# Patient Record
Sex: Male | Born: 2000 | Race: White | Hispanic: No | Marital: Single | Smoking: Never smoker
Health system: Southern US, Community
[De-identification: ages and names within clinical notes are randomized; demographics above are authoritative.]

## PROBLEM LIST (undated history)

## (undated) DIAGNOSIS — Z789 Other specified health status: Secondary | ICD-10-CM

## (undated) HISTORY — PX: ELBOW SURGERY: SHX618

## (undated) HISTORY — PX: WISDOM TOOTH EXTRACTION: SHX21

---

## 2019-10-20 ENCOUNTER — Encounter: Payer: Self-pay | Admitting: Orthopedic Surgery

## 2019-10-20 ENCOUNTER — Ambulatory Visit (INDEPENDENT_AMBULATORY_CARE_PROVIDER_SITE_OTHER): Payer: 59 | Admitting: Orthopedic Surgery

## 2019-10-20 ENCOUNTER — Ambulatory Visit: Payer: Self-pay

## 2019-10-20 ENCOUNTER — Other Ambulatory Visit: Payer: Self-pay

## 2019-10-20 DIAGNOSIS — M25572 Pain in left ankle and joints of left foot: Secondary | ICD-10-CM | POA: Diagnosis not present

## 2019-10-20 NOTE — Progress Notes (Signed)
Office Visit Note   Patient: Adrian Henry           Date of Birth: 30-Jan-2001           MRN: [id number] Visit Date: 10/20/2019 Requested by: No referring provider defined for this encounter. PCP: Patient, No Pcp Per  Subjective: Chief Complaint  Patient presents with  . 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  Was rounding the base when he rolled the ankle.  He believes it was an inversion injury.  He is not really reporting any pain on the lateral side.  All of his pain is medially based.  Denies any fibular tenderness.  Denies any numbness and tingling in the foot.  He is able to ambulate.              ROS: All systems reviewed are negative as they relate to the chief complaint within the history of present illness.  Patient denies  fevers or chills.   Assessment & Plan: Visit Diagnoses:  1. Pain in left ankle and joints of left foot     Plan: Impression is left ankle pain with deltoid sprain.  The deltoid ligament has some hypoechoic regions consistent with injury.  Posterior tib tendon on ultrasound exam looks intact.  No fractures on plain radiographs.  No real lateral sided tenderness or instability although he does have some pain with subtalar range of motion.  Plan is ASO with progressive rehabilitation.  Communicated to the trainer Joselyn Glassman our plan.  I think he should be able to get back in the game within about 8 days.  Continue with anti-inflammatories and icing as well.  Okay for weightbearing as tolerated.  Follow-Up Instructions: Return if symptoms worsen or fail to improve.   Orders:  Orders Placed This Encounter  Procedures  . XR Ankle Complete Left   No orders of the defined types were placed in this encounter.     Procedures: No procedures performed   Clinical Data: No additional findings.  Objective: Vital Signs: There were no vitals taken for this visit.  Physical Exam:    Constitutional: Patient appears well-developed HEENT:  Head: Normocephalic Eyes:EOM are normal Neck: Normal range of motion Cardiovascular: Normal rate Pulmonary/chest: Effort normal Neurologic: Patient is alert Skin: Skin is warm Psychiatric: Patient has normal mood and affect    Ortho Exam: Ortho exam demonstrates normal gait alignment.  He is able to stand on his toes.  Has some pain on the left with subtalar motion but no pain with transverse tarsal or tibiotalar motion.  Palpable intact nontender anterior to posterior to peroneal Achilles tendons.  Pedal pulses palpable.  No masses lymphadenopathy or skin changes noted in that foot region although he does have a little bit of swelling medially around the medial malleolus.  No ATFL sinus Tarsi or CFL tenderness.  Stability is good anterior drawer testing bilaterally.  Ultrasound exam demonstrates intact posterior tib tendon and a few hypoechoic regions in the deltoid mid substance region consistent with noncomplete deltoid ligament injury.  No proximal middle or distal fibular tenderness is present.  Syndesmosis is stable.  Specialty Comments:  No specialty comments available.  Imaging: XR Ankle Complete Left  Result Date: 10/20/2019 AP lateral mortise left ankle reviewed.  No acute fracture.  Mortise is symmetric and stable.  Normal left ankle    PMFS History: There are no problems to display for  this patient.  History reviewed. No pertinent past medical history.  History reviewed. No pertinent family history.  History reviewed. No pertinent surgical history. Social History   Occupational History  . Not on file  Tobacco Use  . Smoking status: Not on file  Substance and Sexual Activity  . Alcohol use: Not on file  . Drug use: Not on file  . Sexual activity: Not on file

## 2020-01-27 ENCOUNTER — Ambulatory Visit (INDEPENDENT_AMBULATORY_CARE_PROVIDER_SITE_OTHER): Payer: 59 | Admitting: Orthopedic Surgery

## 2020-01-27 ENCOUNTER — Encounter: Payer: Self-pay | Admitting: Orthopedic Surgery

## 2020-01-27 ENCOUNTER — Other Ambulatory Visit: Payer: Self-pay

## 2020-01-27 VITALS — Ht 76.0 in | Wt 187.0 lb

## 2020-01-27 DIAGNOSIS — M25521 Pain in right elbow: Secondary | ICD-10-CM | POA: Diagnosis not present

## 2020-01-28 ENCOUNTER — Encounter: Payer: Self-pay | Admitting: Orthopedic Surgery

## 2020-01-28 NOTE — Progress Notes (Signed)
Office Visit Note   Patient: Adrian Henry           Date of Birth: 2001-04-11           MRN: [id number] Visit Date: 01/27/2020 Requested by: No referring provider defined for this encounter. PCP: Patient, No Pcp Per  Subjective: Chief Complaint  Patient presents with  . Right Elbow - Pain    DOI 01/23/2020    HPI: Adrian Henry is a 19 year old patient with right elbow pain.  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  He is a Producer, television/film/video.  Date of injury 01/23/2020.  He was throwing and he felt his elbow locked up after a long throat.  He has had significant locking symptoms since that time with throwing and with weight lifting.  He is not able to do the bench press.  This is been going on for a month but came to a crescendo with that outfield throat.  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  This was done with Dr. Caralee Ates in [place].  Takes ibuprofen but he is unsure if it helps.  Denies any numbness and tingling in the hand.              ROS: All systems reviewed are negative as they relate to the chief complaint within the history of present illness.  Patient denies  fevers or chills.   Assessment & Plan: Visit Diagnoses:  1. Pain in right elbow     Plan: Impression is likely recurrence of right elbow noncalcified loose bodies.  Plain radiographs examined from the athletic room clinic are negative for any ossified loose bodies.  Some early degenerative changes are present in the radiocapitellar joint.  Plan is MRI arthrogram to evaluate for loose bodies noncalcified in the joint.  May need repeat elbow arthroscopy.  Follow-up with me after that study  Follow-Up Instructions: Return for after MRI.   Orders:  Orders Placed This Encounter  Procedures  . DL FLUORO GUIDED NEEDLE PLC ASPIRATION / INJECTTION/LOC  . MR Elbow Right w/ contrast   No orders of the defined types were placed in this encounter.     Procedures: No procedures performed   Clinical  Data: No additional findings.  Objective: Vital Signs: Ht 6\' 4"  (1.93 m)   Wt 187 lb (84.8 kg)   BMI 22.76 kg/m   Physical Exam:   Constitutional: Patient appears well-developed HEENT:  Head: Normocephalic Eyes:EOM are normal Neck: Normal range of motion Cardiovascular: Normal rate Pulmonary/chest: Effort normal Neurologic: Patient is alert Skin: Skin is warm Psychiatric: Patient has normal mood and affect    Ortho Exam: Ortho exam demonstrates well-healed portal incisions.  He lacks about 3 to 4 degrees of full extension and has full flexion.  No pain or crepitus with pronation supination flexion or extension.  No tenderness over the medial or lateral epicondyle.  Trace effusion is present in the elbow joint.  Motor or sensory function to the hand is intact.  No other masses lymphadenopathy or skin changes noted in that elbow region.  Specialty Comments:  No specialty comments available.  Imaging: No results found.   PMFS History: There are no problems to display for this patient.  History reviewed. No pertinent past medical history.  History reviewed. No pertinent family history.  History reviewed. No pertinent surgical history. Social History   Occupational History  . Not on file  Tobacco Use  . Smoking status: Never Smoker  . Smokeless tobacco:  Never Used  Substance and Sexual Activity  . Alcohol use: Yes  . Drug use: Not on file  . Sexual activity: Not on file

## 2020-02-08 ENCOUNTER — Ambulatory Visit
Admission: RE | Admit: 2020-02-08 | Discharge: 2020-02-08 | Disposition: A | Discharge status: Home / Self Care | Payer: 59 | Source: Ambulatory Visit | Attending: Orthopedic Surgery | Admitting: Orthopedic Surgery

## 2020-02-08 ENCOUNTER — Other Ambulatory Visit: Payer: Self-pay

## 2020-02-08 DIAGNOSIS — M25521 Pain in right elbow: Secondary | ICD-10-CM

## 2020-02-08 IMAGING — MR MR ELBOW*R* W/CM
4 of 5 series · 26 of 40 positions shown · IV contrast (agent unspecified)
Comparison: Images from contrast injection reviewed.

CLINICAL DATA: Right elbow locking, popping and weakness for 3
weeks. History of prior surgery for an osteochondral lesion.
Question loose body in the joint. No recent injury.

EXAM:
MRI OF THE RIGHT ELBOW WITH CONTRAST (MR Arthrogram)
TECHNIQUE: Multiplanar, multisequence MR imaging of the elbow was performed
immediately following contrast injection into the radiocapitellar
joint under fluoroscopic guidance. No intravenous contrast was
administered.

[Series 4: t1_tse_ax_fs · axial · 4.0mm · 0.23mm/px · z∈[-98,+16]mm · 8 of 24 slices shown]
[im 1/24]
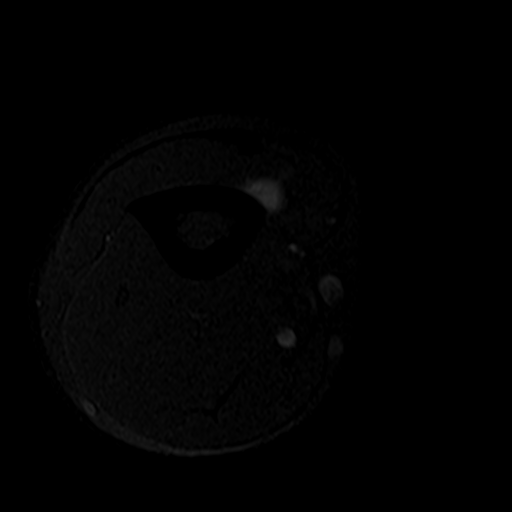
[im 3/24]
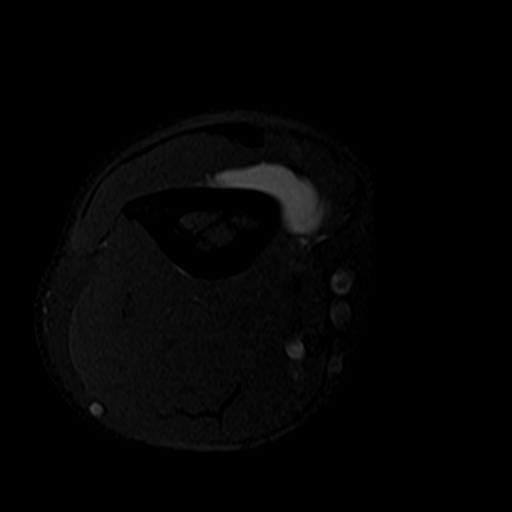
[im 8/24]
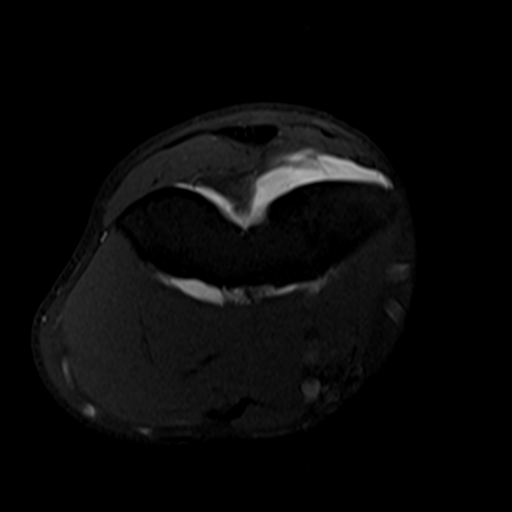
[im 11/24]
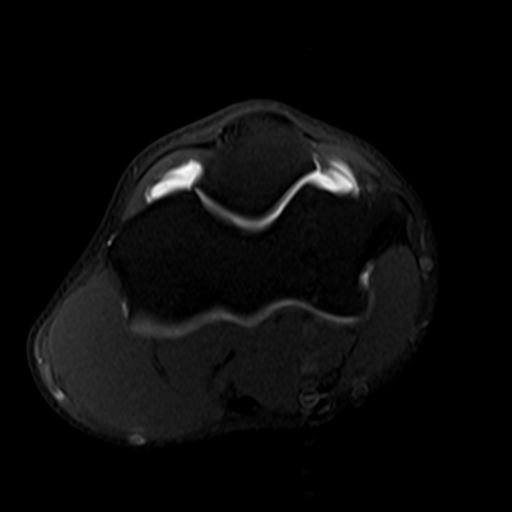
[im 13/24]
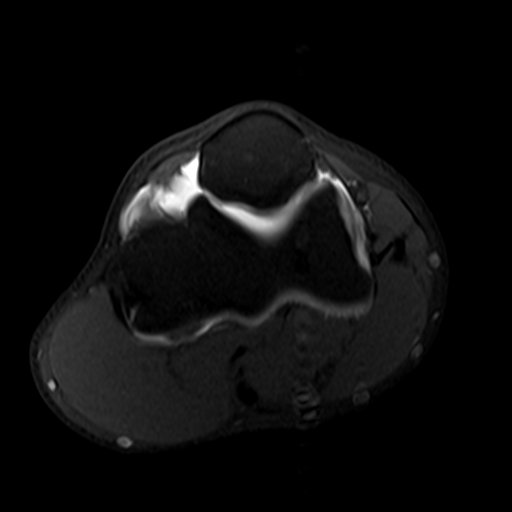
[im 16/24]
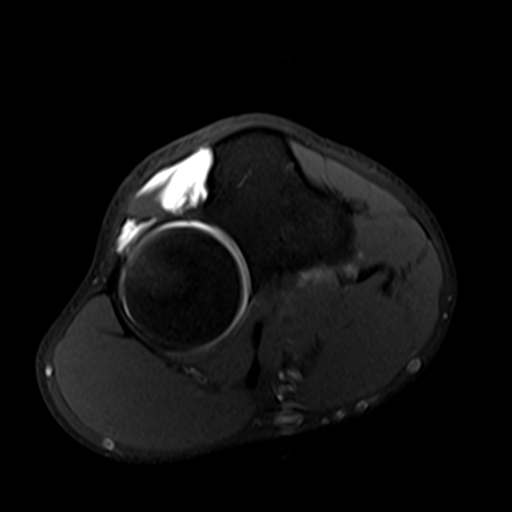
[im 21/24]
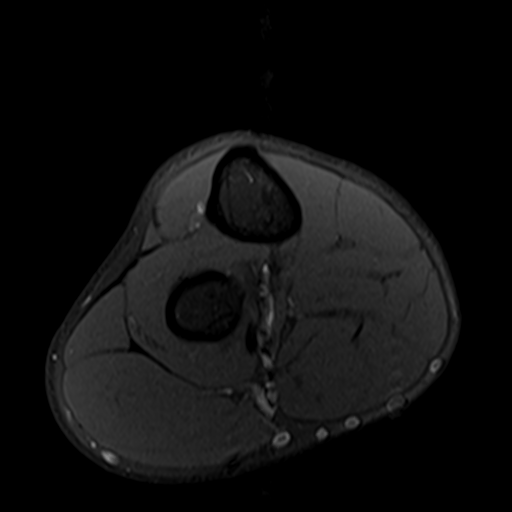
[im 24/24]
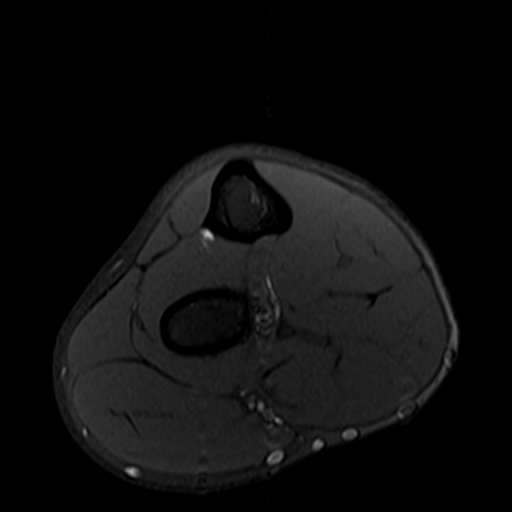

[Series 5: t1_tse_cor_fs · coronal · 4.0mm · 0.27mm/px · 8 of 19 slices shown]
[im 1/19]
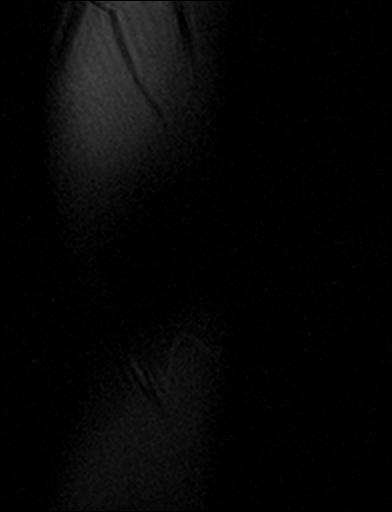
[im 3/19]
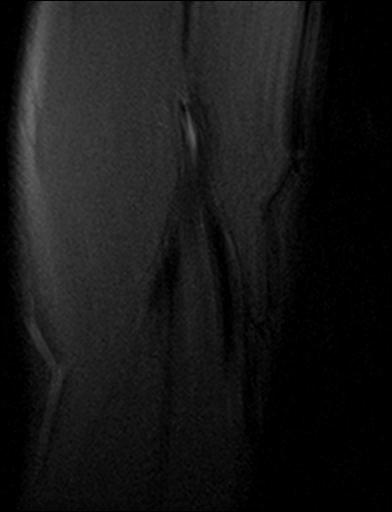
[im 6/19]
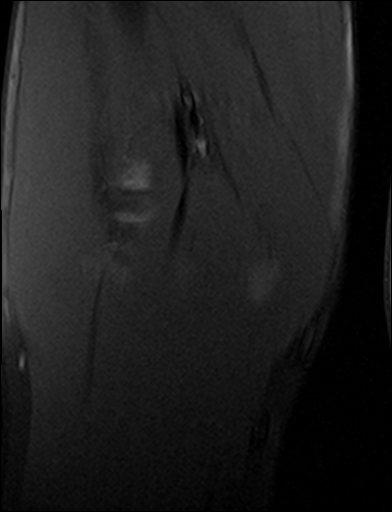
[im 8/19]
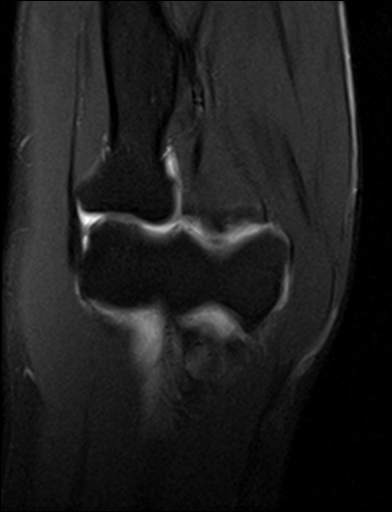
[im 11/19]
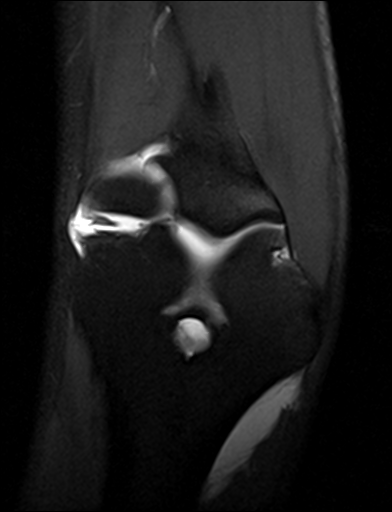
[im 13/19]
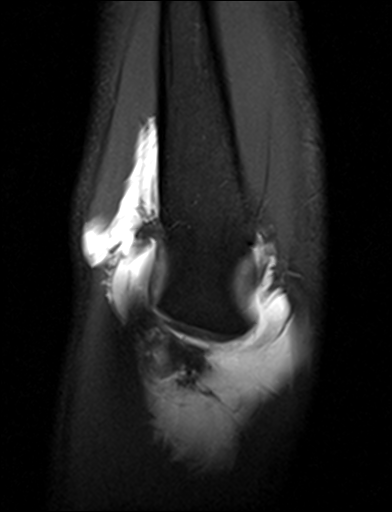
[im 16/19]
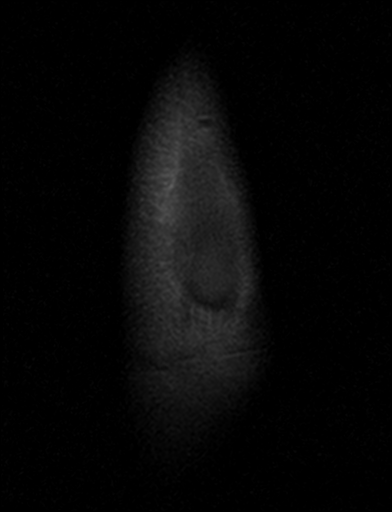
[im 19/19]
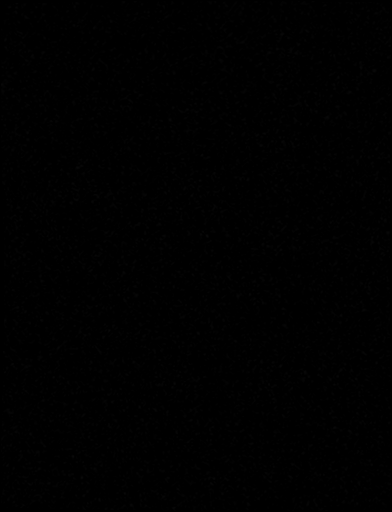

[Series 6: t1_tse_sag_fs · sagittal · 4.0mm · 0.27mm/px · 3 of 21 slices shown]
[im 3/21]
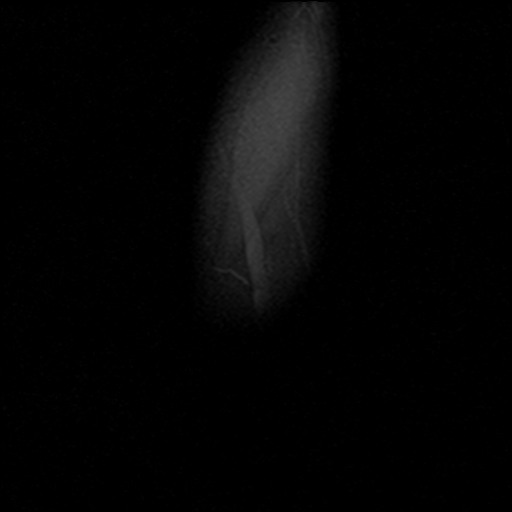
[im 12/21]
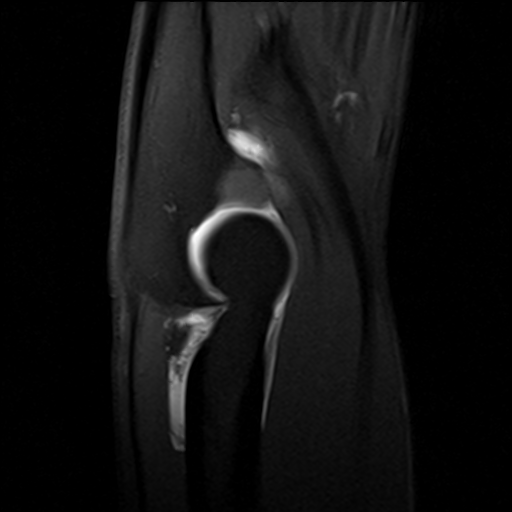
[im 18/21]
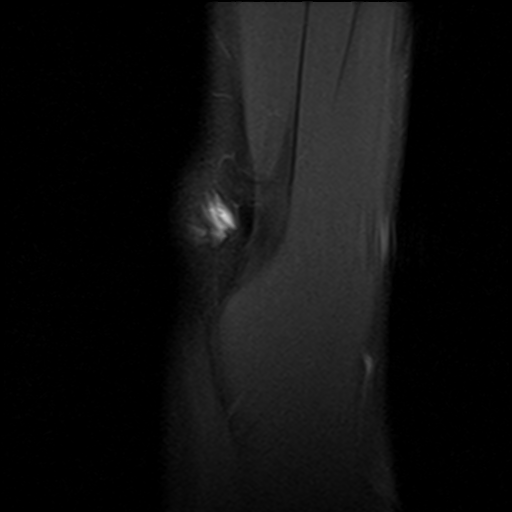

[Series 8: T2 fat-sat · coronal · 3.0mm · 0.55mm/px · 7 of 19 slices shown]
[im 1/19]
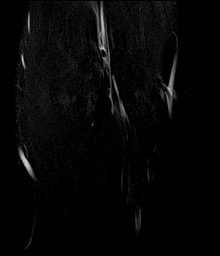
[im 4/19]
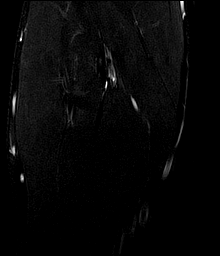
[im 7/19]
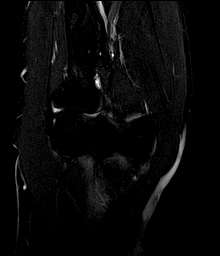
[im 10/19]
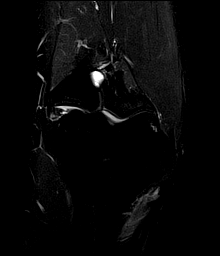
[im 13/19]
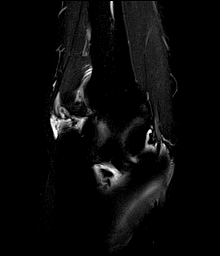
[im 16/19]
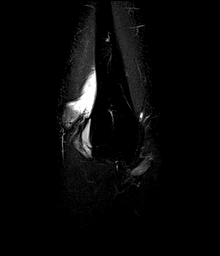
[im 19/19]
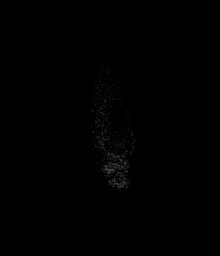

[26 of 40 positions shown; findings below may reference images not displayed]

FINDINGS: TENDONS

Common forearm flexor origin: Intact with normal signal.

Common forearm extensor origin: Intact with normal signal.

Biceps: Intact with normal signal.

Triceps: Intact with normal signal.

LIGAMENTS

Medial stabilizers: Intact and normal in appearance.

Lateral stabilizers:  Intact and normal in appearance.

Cartilage: No focal defect is identified. Cartilage surfaces appear
preserved.

Joint: The joint is distended with contrast. No loose body is
identified.

Cubital tunnel: Normal.

Bones: Marrow signal is normal throughout. No fracture, stress
change or focal lesion.
IMPRESSION: No loose body or other abnormality to explain the patient's symptoms
is identified. Negative MR arthrogram right elbow.

## 2020-02-08 IMAGING — XA DG FLUORO GUIDE NDL PLC/BX
2 series · 2 of 2 positions shown · non-contrast
Comparison: none

CLINICAL DATA: Right elbow pain.

[Series 1: ortho standard · 1 of 1 slices shown (1 of 2)]
[im 1/1]
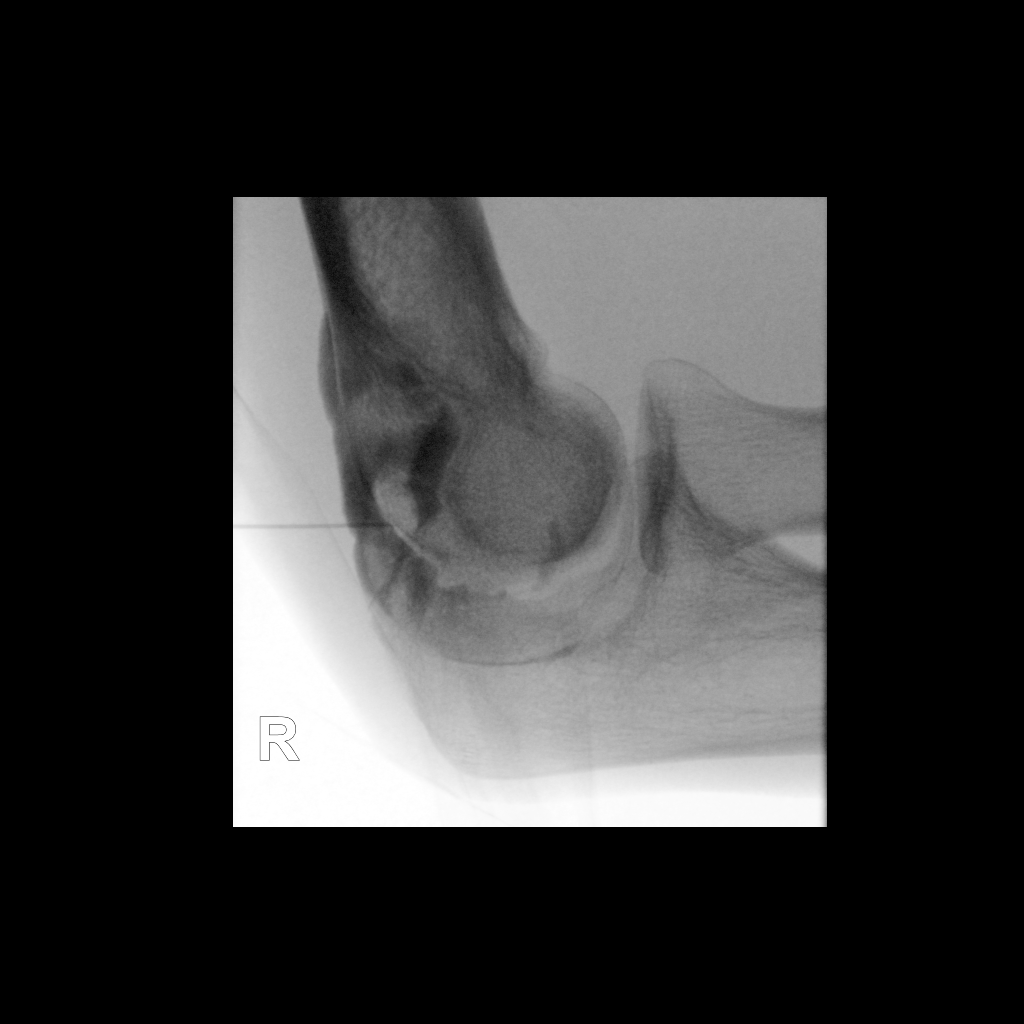

[Series 2: ortho standard · 1 of 1 slices shown (2 of 2)]
[im 1/1]
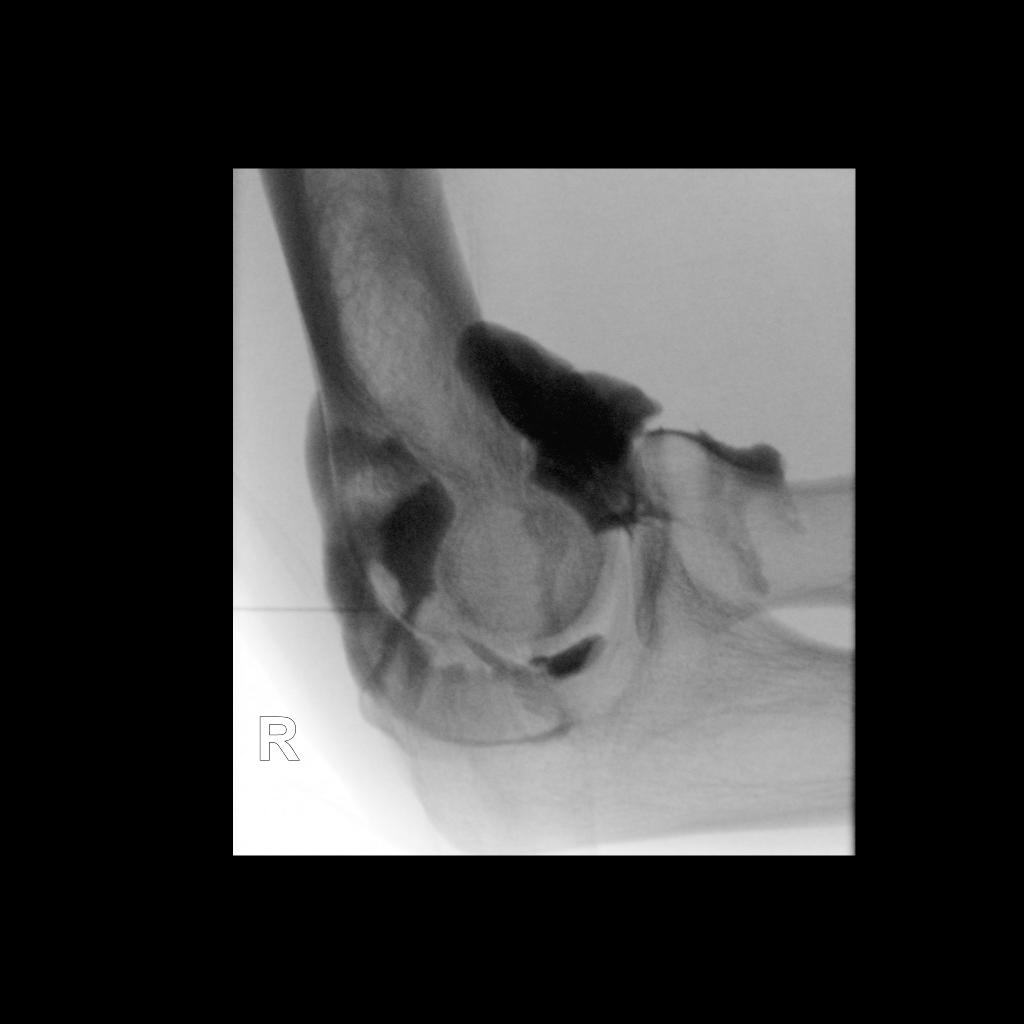

[2 of 2 positions shown; findings below may reference images not displayed]

EXAM:
RIGHT ELBOW INJECTION FOR MRI

FLUOROSCOPY TIME:  Fluoroscopy Time:  7 seconds

Radiation Exposure Index (if provided by the fluoroscopic device):
1.73 microGray*m^2

Number of Acquired Spot Images: 0

PROCEDURE:
Overlying skin prepped with Betadine, draped in the usual sterile
fashion, and infiltrated locally with 1% lidocaine. The 25 gauge
skin needle used for local anesthesia was advanced into the elbow
joint via a posterior approach. A mixture of 0.1 mL of MultiHance,
15 mL of Isovue-M 200, and 5 mL of sterile saline was injected and
demonstrated intra-articular contrast spread. In total, 10 mL of
this mixture were injected with adequate joint distension achieved
fluoroscopically. There was no immediate complication.
IMPRESSION: Technically successful right elbow injection under fluoroscopy for
MR arthrogram.

## 2020-02-08 MED ORDER — IOPAMIDOL (ISOVUE-M 200) INJECTION 41%
13.0000 mL | Freq: Once | INTRAMUSCULAR | Status: AC
  Administered 2020-02-08: 13 mL via INTRA_ARTICULAR

## 2020-08-28 ENCOUNTER — Other Ambulatory Visit: Payer: Self-pay

## 2020-08-28 DIAGNOSIS — U071 COVID-19: Secondary | ICD-10-CM

## 2020-08-29 ENCOUNTER — Other Ambulatory Visit: Payer: Self-pay

## 2020-08-29 ENCOUNTER — Ambulatory Visit (INDEPENDENT_AMBULATORY_CARE_PROVIDER_SITE_OTHER): Payer: 59 | Admitting: *Deleted

## 2020-08-29 DIAGNOSIS — U071 COVID-19: Secondary | ICD-10-CM

## 2020-08-29 NOTE — Progress Notes (Signed)
Post Covid EKG performed at the request of Dr. Bjorn Pippin.   EKG reviewed by Dr. Bjorn Pippin and no significant abnormalities noted.   Patient made aware and verbalized understanding.   Bea Laura, RN

## 2020-09-06 ENCOUNTER — Encounter (HOSPITAL_COMMUNITY): Payer: Self-pay

## 2020-09-06 ENCOUNTER — Other Ambulatory Visit: Payer: Self-pay

## 2020-09-06 ENCOUNTER — Ambulatory Visit (HOSPITAL_BASED_OUTPATIENT_CLINIC_OR_DEPARTMENT_OTHER): Payer: 59

## 2020-09-06 ENCOUNTER — Other Ambulatory Visit: Payer: 59

## 2020-09-06 ENCOUNTER — Ambulatory Visit (HOSPITAL_COMMUNITY): Payer: 59 | Attending: Cardiology

## 2020-09-06 DIAGNOSIS — U071 COVID-19: Secondary | ICD-10-CM | POA: Insufficient documentation

## 2020-09-06 LAB — TROPONIN I (HIGH SENSITIVITY): Troponin I (High Sensitivity): 4 ng/L (ref <18)

## 2020-09-06 NOTE — Progress Notes (Unsigned)
Verified appointment "no show" status with L. Walters at [facility].

## 2020-09-06 NOTE — Progress Notes (Signed)
Verified appointment "no show" status with L. Walters at [facility].

## 2020-09-08 LAB — ECHOCARDIOGRAM COMPLETE
Area-P 1/2: 3.5 cm2
S' Lateral: 3.7 cm

## 2020-09-27 ENCOUNTER — Other Ambulatory Visit (HOSPITAL_COMMUNITY): Payer: 59

## 2020-11-13 ENCOUNTER — Other Ambulatory Visit (HOSPITAL_COMMUNITY): Payer: Self-pay | Admitting: Pediatrics

## 2020-11-13 ENCOUNTER — Other Ambulatory Visit: Payer: Self-pay | Admitting: Pediatrics

## 2020-11-13 DIAGNOSIS — M25521 Pain in right elbow: Secondary | ICD-10-CM

## 2020-11-19 ENCOUNTER — Ambulatory Visit (HOSPITAL_COMMUNITY): Admission: RE | Admit: 2020-11-19 | Payer: 59 | Source: Ambulatory Visit

## 2020-11-19 ENCOUNTER — Encounter (HOSPITAL_COMMUNITY): Payer: Self-pay

## 2020-11-26 ENCOUNTER — Encounter (INDEPENDENT_AMBULATORY_CARE_PROVIDER_SITE_OTHER): Payer: Self-pay

## 2020-11-26 ENCOUNTER — Ambulatory Visit (HOSPITAL_COMMUNITY)
Admission: RE | Admit: 2020-11-26 | Discharge: 2020-11-26 | Disposition: A | Discharge status: Home / Self Care | Payer: 59 | Source: Ambulatory Visit | Attending: Pediatrics | Admitting: Pediatrics

## 2020-11-26 DIAGNOSIS — M25521 Pain in right elbow: Secondary | ICD-10-CM | POA: Insufficient documentation

## 2020-11-26 IMAGING — MR MR ELBOW*R* WO/W CM
5 of 8 series · 25 of 40 positions shown · IV contrast (gadavist)
Comparison: MR right elbow arthrogram dated [DATE].

CLINICAL DATA: Right elbow popping and shooting pains into the
forearm for the past 2 weeks. No injury. History of prior surgery in
[[facility]] for osteochondral lesion.

EXAM:
MRI OF THE RIGHT ELBOW WITHOUT AND WITH CONTRAST
TECHNIQUE: Multiplanar, multisequence MR imaging of the elbow was performed
before and after the administration of intravenous contrast.
CONTRAST:  8mL GADAVIST GADOBUTROL 1 MMOL/ML IV SOLN

[Series 3: T1 · axial · right · 3.0mm · 0.25mm/px · z∈[-79,+15]mm · 5 of 25 slices shown (1 of 2)]
[im 1/25]
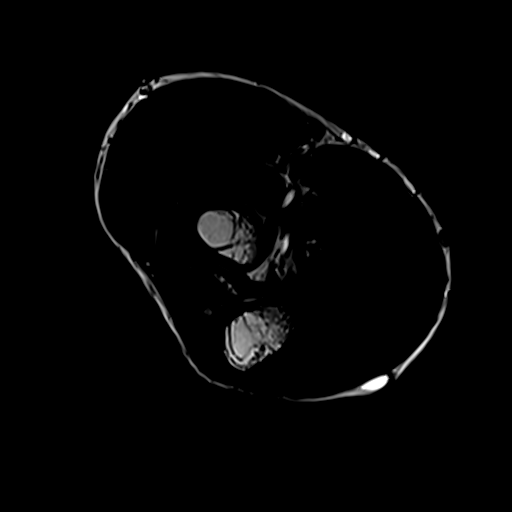
[im 7/25]
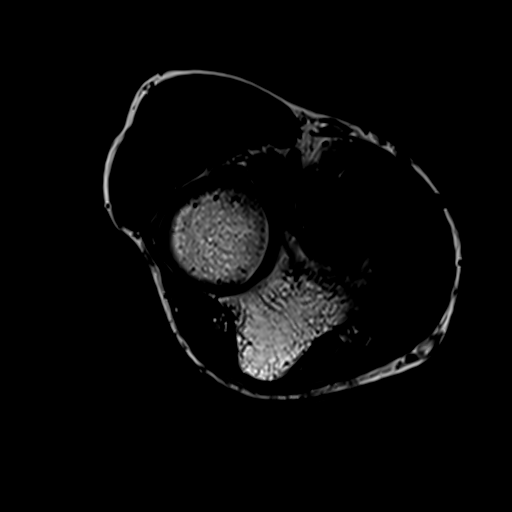
[im 13/25]
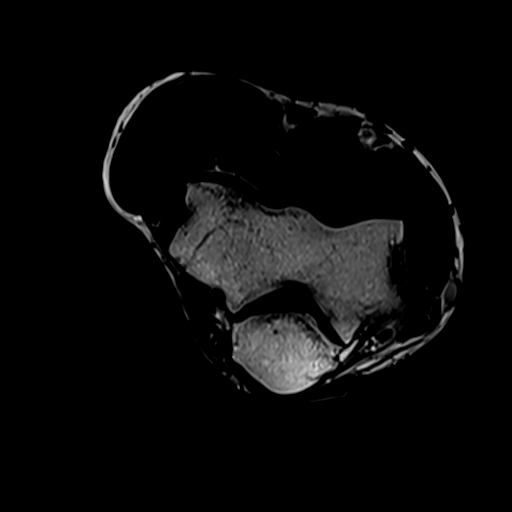
[im 19/25]
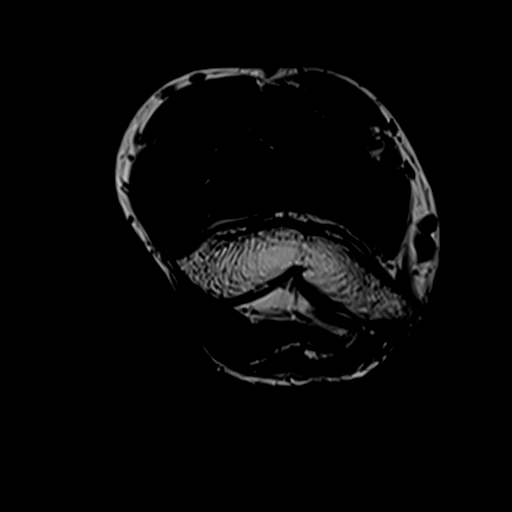
[im 25/25]
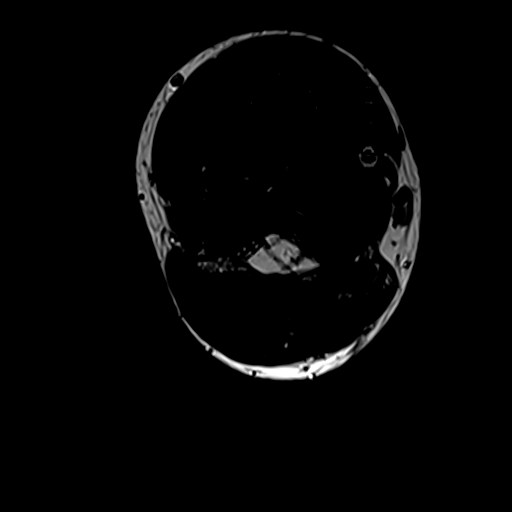

[Series 4: T2 fat-sat · axial · right · 3.0mm · 0.25mm/px · z∈[-79,+15]mm · 5 of 25 slices shown]
[im 1/25]
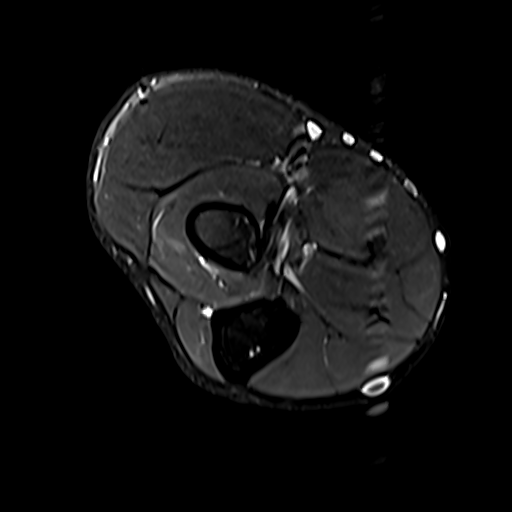
[im 7/25]
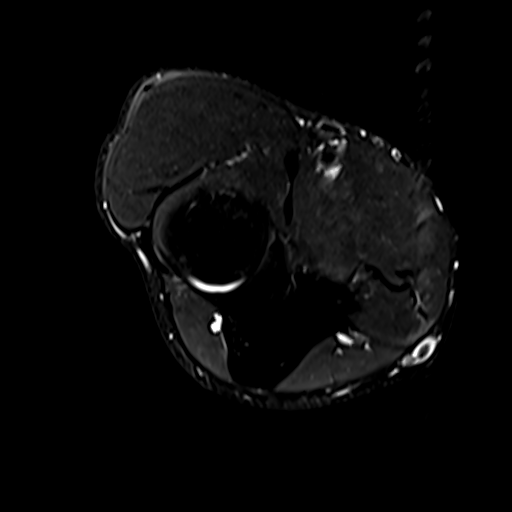
[im 13/25]
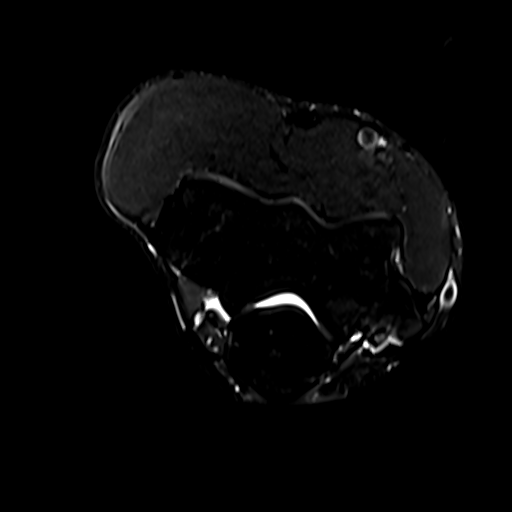
[im 19/25]
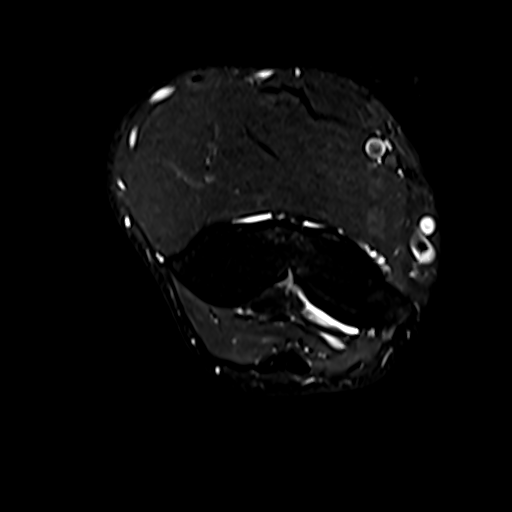
[im 25/25]
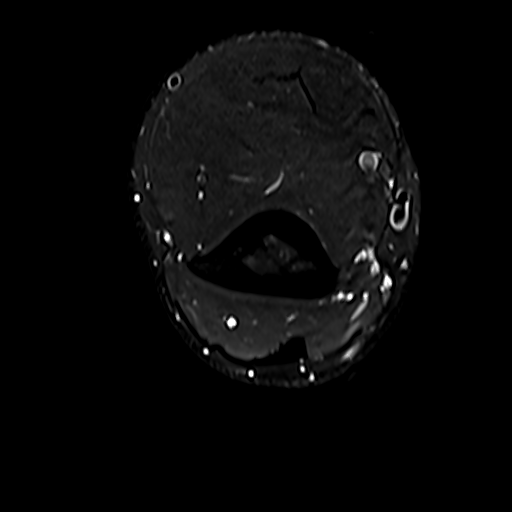

[Series 6: T1 · coronal · right · 3.0mm · 0.62mm/px · 4 of 22 slices shown (2 of 2)]
[im 1/22]
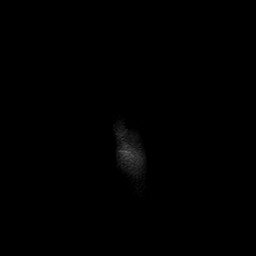
[im 8/22]
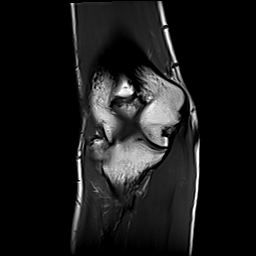
[im 15/22]
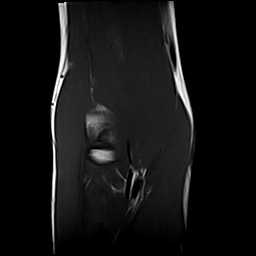
[im 22/22]
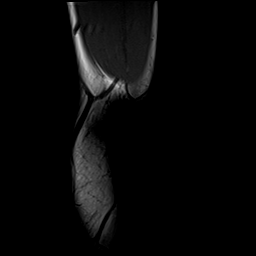

[Series 7: PD fat-sat · sagittal · right · 3.0mm · 0.47mm/px · 6 of 29 slices shown]
[im 1/29]
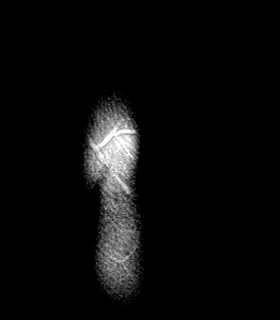
[im 6/29]
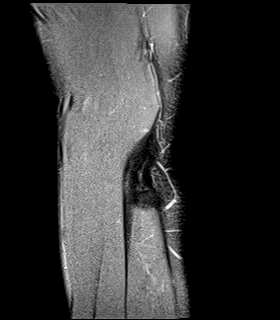
[im 12/29]
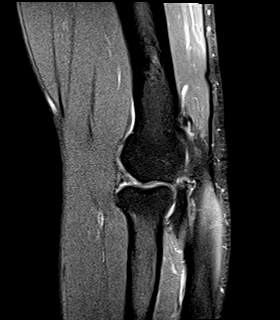
[im 17/29]
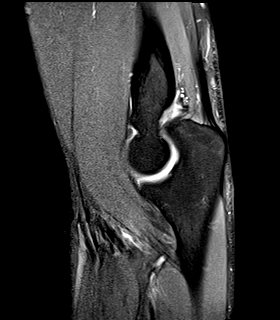
[im 23/29]
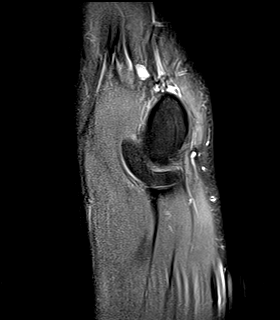
[im 29/29]
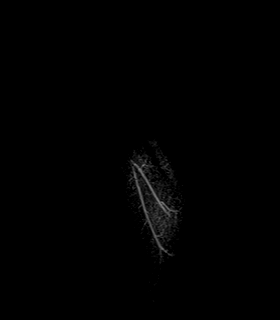

[Series 8: T1 fat-sat · axial · non-contrast · right · 3.0mm · 0.25mm/px · z∈[-79,+15]mm · 5 of 25 slices shown]
[im 1/25]
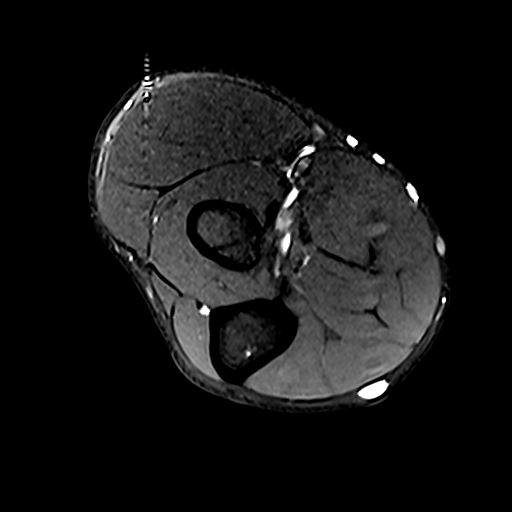
[im 7/25]
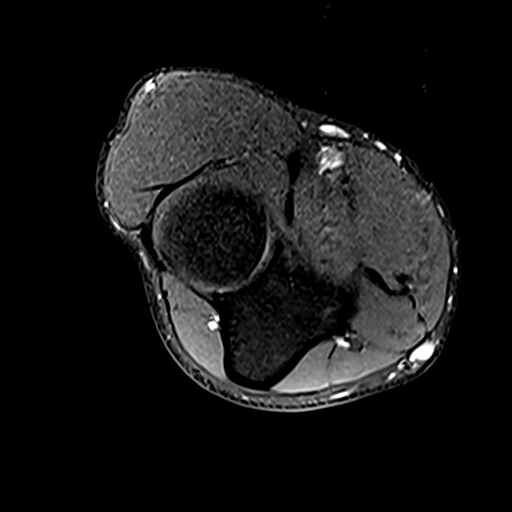
[im 13/25]
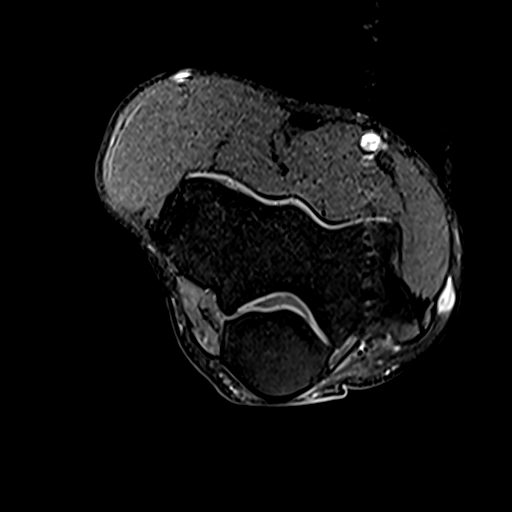
[im 19/25]
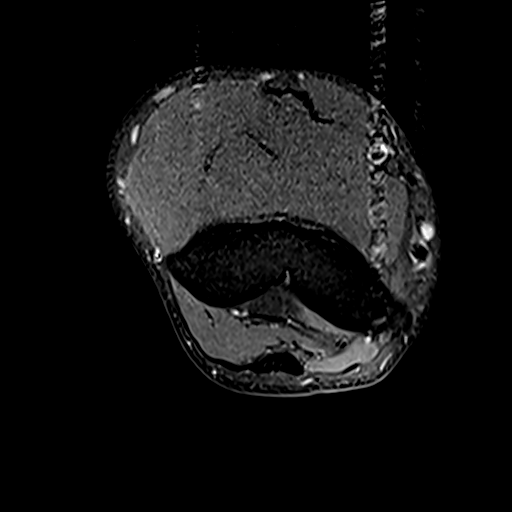
[im 25/25]
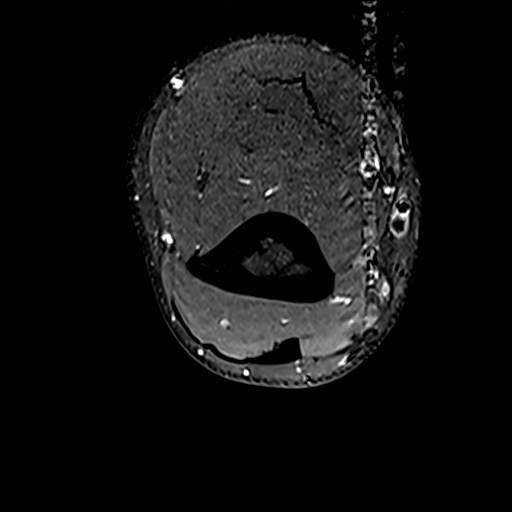

[25 of 40 positions shown; findings below may reference images not displayed]

FINDINGS: TENDONS

Common forearm flexor origin: Intact.

Common forearm extensor origin: Intact.

Biceps: Intact.

Triceps: Intact.

LIGAMENTS

Medial stabilizers: The ulnar collateral ligament is intact.

Lateral stabilizers: The lateral ulnar and radial collateral
ligaments are intact.

Cartilage: Unchanged cartilage thinning over the trochlea and ulna.
No chondral defect. Asymmetrically thickened cartilage along the
anterior capitellum likely at the site of prior osteochondral lesion
repair.

Joint: No joint effusion or loose body observed.

Cubital tunnel: Unremarkable.  The ulnar nerve appears normal.

Bones: No acute fracture or dislocation. No suspicious bone lesion.
Unchanged ulnotrochlear marginal osteophytes. Possible tiny
intra-articular body versus osteophyte near the posterior capitellum
(series 6, image 10).

Other: None.
IMPRESSION: 1. Unchanged mild elbow osteoarthritis. Possible tiny
intra-articular body versus osteophyte near the posterior
capitellum.

## 2020-11-26 MED ORDER — GADOBUTROL 1 MMOL/ML IV SOLN
8.0000 mL | Freq: Once | INTRAVENOUS | Status: AC | PRN
  Administered 2020-11-26: 8 mL via INTRAVENOUS

## 2021-12-23 ENCOUNTER — Other Ambulatory Visit (HOSPITAL_BASED_OUTPATIENT_CLINIC_OR_DEPARTMENT_OTHER): Payer: Self-pay | Admitting: Orthopaedic Surgery

## 2021-12-23 ENCOUNTER — Encounter (HOSPITAL_BASED_OUTPATIENT_CLINIC_OR_DEPARTMENT_OTHER): Payer: Self-pay | Admitting: Orthopaedic Surgery

## 2021-12-23 MED ORDER — MELOXICAM 15 MG PO TABS: 15.0000 mg | ORAL_TABLET | Freq: Every day | ORAL | 0 refills | Status: AC

## 2021-12-23 NOTE — Progress Notes (Signed)
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  He states that he felt pain in the testicle approximately 2 weeks prior.  This did not have any inciting incident.  This immediately resolved and did not persist.  Since that time he has had pain over the pubic symphysis at the insertion of the abdominals.  This was made worse when he slid into a base at the most recent game and subsequently felt worsening pain.  Denies any changes in bowel or bladder.  Denies any previous history similar to this.  Denies any numbness at this time.   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  At this time I would like to treat him with 2 weeks of Mobic and anti-inflammatories and I believe that there is a high likelihood that he will make a full recovery.  If he has not improved in 2 weeks we will pursue an MRI.  Comprehensive Musculoskeletal Exam:    Tenderness to palpation about the abdominal insertion over the pubis.  There is no inguinal hernia appreciated on Valsalva maneuver.  No pain with range of motion about the hip.  Imaging:    None  [name], MD Attending Physician, Orthopedic Surgery  This document was dictated using Dragon voice recognition software. A reasonable attempt at proof reading has been made to minimize errors.

## 2022-01-17 ENCOUNTER — Ambulatory Visit (HOSPITAL_BASED_OUTPATIENT_CLINIC_OR_DEPARTMENT_OTHER): Payer: 59 | Admitting: Orthopaedic Surgery

## 2022-01-20 ENCOUNTER — Ambulatory Visit
Admission: RE | Admit: 2022-01-20 | Discharge: 2022-01-20 | Disposition: A | Discharge status: Home / Self Care | Payer: 59 | Source: Ambulatory Visit | Attending: Orthopaedic Surgery | Admitting: Orthopaedic Surgery

## 2022-01-20 ENCOUNTER — Other Ambulatory Visit (HOSPITAL_BASED_OUTPATIENT_CLINIC_OR_DEPARTMENT_OTHER): Payer: Self-pay | Admitting: Orthopaedic Surgery

## 2022-01-20 ENCOUNTER — Encounter (HOSPITAL_BASED_OUTPATIENT_CLINIC_OR_DEPARTMENT_OTHER): Payer: Self-pay | Admitting: Orthopaedic Surgery

## 2022-01-20 DIAGNOSIS — S76011A Strain of muscle, fascia and tendon of right hip, initial encounter: Secondary | ICD-10-CM

## 2022-01-20 NOTE — Progress Notes (Signed)
History of Present Illness:   01/20/2022: Presents today for follow-up of the right hip and groin pain.  He states that after a course of anti-inflammatories he did experience some improvement in the pain although this did recur recently when he began stretching.  He states that stretching forward is the biggest type of aggravating injury for this injury.  Here today for further assessment.  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  He states that he felt pain in the testicle approximately 2 weeks prior.  This did not have any inciting incident.  This immediately resolved and did not persist.  Since that time he has had pain over the pubic symphysis at the insertion of the abdominals.  This was made worse when he slid into a base at the most recent game and subsequently felt worsening pain.  Denies any changes in bowel or bladder.  Denies any previous history similar to this.  Denies any numbness at this time.   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  These types of exercises are the exact exercises that cause him pain.  At this time I believe an MRI is indicated so that we can rule out sports hernia and to further assess the psoas.  Comprehensive Musculoskeletal Exam:    Today there is no tenderness to palpation about the abdominal insertion over the pubis.  There is no inguinal hernia appreciated on Valsalva maneuver.  No pain with range of motion about the hip.  He has tenderness about the right lower near the psoas.  Imaging:    None  [name], MD Attending Physician, Orthopedic Surgery  This document was dictated using Dragon voice recognition software. A reasonable attempt at proof  reading has been made to minimize errors.

## 2022-01-22 ENCOUNTER — Ambulatory Visit (INDEPENDENT_AMBULATORY_CARE_PROVIDER_SITE_OTHER): Payer: 59 | Admitting: Orthopaedic Surgery

## 2022-01-22 DIAGNOSIS — S76011A Strain of muscle, fascia and tendon of right hip, initial encounter: Secondary | ICD-10-CM | POA: Diagnosis not present

## 2022-01-22 DIAGNOSIS — M25551 Pain in right hip: Secondary | ICD-10-CM

## 2022-01-22 MED ORDER — LIDOCAINE HCL 1 % IJ SOLN
4.0000 mL | INTRAMUSCULAR | Status: AC | PRN
  Administered 2022-01-22: 4 mL

## 2022-01-22 MED ORDER — TRIAMCINOLONE ACETONIDE 40 MG/ML IJ SUSP
80.0000 mg | INTRAMUSCULAR | Status: AC | PRN
  Administered 2022-01-22: 80 mg via INTRA_ARTICULAR

## 2022-01-22 NOTE — Progress Notes (Signed)
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  He states that he felt pain in the testicle approximately 2 weeks prior.  This did not have any inciting incident.  This immediately resolved and did not persist.  Since that time he has had pain over the pubic symphysis at the insertion of the abdominals.  This was made worse when he slid into a base at the most recent game and subsequently felt worsening pain.  Denies any changes in bowel or bladder.  Denies any previous history similar to this.  Denies any numbness at this time.   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  At today's visit I would like to perform ultrasound-guided right psoas injection so as to hopefully get him relief and bridge him into a stretching program for this.     Procedure Note  Patient: Adrian Henry             Date of Birth: 03/29/2001           MRN: [id number]             Visit Date: 01/22/2022  Procedures: Visit Diagnoses: No diagnosis found.  Large Joint Inj: R hip joint on 01/22/2022 1:44 PM Indications: pain Details: 22 G 3.5 in needle, ultrasound-guided anterolateral approach  Arthrogram: No  Medications: 4 mL lidocaine 1 %; 80 mg triamcinolone acetonide 40 MG/ML Outcome: tolerated well, no immediate complications Procedure, treatment alternatives, risks and benefits explained, specific risks discussed. Consent was given by the patient. Immediately prior to procedure a time out was called to verify the correct patient, procedure, equipment, support staff and site/side marked as required. Patient was prepped and draped in the usual sterile fashion.       Comprehensive Musculoskeletal Exam:    Today there is no  tenderness to palpation about the abdominal insertion over the pubis.  There is no inguinal hernia appreciated on Valsalva maneuver.  No pain with range of motion about the hip.  He has tenderness about the right lower near the psoas.  Imaging:    MRI right hip is essentially within normal limits although there is some fluid around the right psoas tendon  Adrian Cote, MD Attending Physician, Orthopedic Surgery  This document was dictated using Dragon voice recognition software. A reasonable attempt at proof reading has been made to minimize errors.

## 2022-04-10 ENCOUNTER — Encounter: Payer: Self-pay | Admitting: Sports Medicine

## 2022-04-10 ENCOUNTER — Other Ambulatory Visit: Payer: Self-pay | Admitting: Sports Medicine

## 2022-04-10 MED ORDER — METHYLPREDNISOLONE 4 MG PO TBPK: ORAL_TABLET | ORAL | 0 refills | Status: AC

## 2022-04-10 NOTE — Progress Notes (Signed)
  [employer] Training Room Note  History of Present Illness:   Adrian Henry is a 21 y.o. male who presents with left posterior lateral shoulder pain.  He is a Psychiatric nurse.  Patient was hitting taking batting practice when on 03/30/2022 he followed through and he felt a sharp pain over the lateral aspect of the shoulder.  He took some time off from hitting it to be modification but his pain is still continued.  He has been taking ibuprofen/anti-inflammatories.  He has worked with his ATC doing dry needling as well as rotator cuff rehab as well as ice.  His pain still persists more so when he is going through hitting.  He denies any numbness tingling or redness.   Assessment and Plan:   Acute left shoulder pain - exam indicative of supraspinatus and rotator cuff pathology versus posterior rotator cuff bursitis/impingement.  Recommended activity modification, rehab with rotator cuff stretching and very light weight range of motion exercises -> will progress to strengthening as pain improves.  Will place on 6-day Medrol 4 mg Dosepak.  Will reevaluate in 2 weeks, if pain is not improved or worsening, likely will bring into the office for evaluation and imaging (XR -> MRI).  Comprehensive Musculoskeletal Exam:    - Left shoulder: No gross swelling or skin changes.  Full active range of motion in forward flexion/extension, internal and external rotation.  Some pain at endrange ER.  Negative drop arm test.  Positive empty can test and pain with resisted external rotation.  There is some weakness with empty can and resisted ER, unclear whether true weakness or weakness secondary to pain.  Negative O'Brien testing, negative speeds test.  Some pain at end range ER.  Imaging:    N/a --> consider if not improving  Madelyn Brunner, DO Primary Care Sports Medicine - [facility] Team Physician [facility]  This document was dictated using Dragon voice recognition software. A reasonable attempt at proof  reading has been made to minimize errors.

## 2022-04-17 ENCOUNTER — Encounter: Payer: Self-pay | Admitting: Sports Medicine

## 2022-04-17 ENCOUNTER — Other Ambulatory Visit: Payer: Self-pay | Admitting: Sports Medicine

## 2022-04-17 DIAGNOSIS — M25512 Pain in left shoulder: Secondary | ICD-10-CM

## 2022-04-17 NOTE — Progress Notes (Signed)
  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  I saw him previously weeks ago in the training room for left posterior lateral shoulder pain.  He has completed a Medrol Dosepak, been working with his trainer for rehab and physical therapy without any relief.  Pain still continues in the shoulder that is limiting him from hitting and activities related to baseball.  He has not found any relief.   Assessment and Plan:   Acute left shoulder pain -after injury swinging playing baseball.  Suspicious for supraspinatus and rotator cuff pathology versus posterior capsule pathology.  At this point given that he has been unable to return to sport with continued pain despite rehab/therapy and oral medications, I think an MRI is needed to better evaluate the rotator cuff, posterior capsule and shoulder labrum.  We will move forward with MRI of the left shoulder with contrast.  We will follow-up after this, treatment would be pending on MRI findings although may proceed with subacromial joint injection.   Imaging:    -MRI of left shoulder with contrast  Elba Barman, DO [place]  This document was dictated using Dragon voice recognition software. A reasonable attempt at proof reading has been made to minimize errors.

## 2022-04-22 ENCOUNTER — Telehealth: Payer: Self-pay | Admitting: Sports Medicine

## 2022-04-22 ENCOUNTER — Ambulatory Visit
Admission: RE | Admit: 2022-04-22 | Discharge: 2022-04-22 | Disposition: A | Discharge status: Home / Self Care | Payer: 59 | Source: Ambulatory Visit | Attending: Sports Medicine | Admitting: Sports Medicine

## 2022-04-22 DIAGNOSIS — M25512 Pain in left shoulder: Secondary | ICD-10-CM

## 2022-04-22 MED ORDER — IOPAMIDOL (ISOVUE-M 200) INJECTION 41%
12.0000 mL | Freq: Once | INTRAMUSCULAR | Status: AC
  Administered 2022-04-22: 12 mL via INTRA_ARTICULAR

## 2022-04-22 NOTE — Telephone Encounter (Signed)
Called pt 1X and left vm for pt to call and set an MRI review appt with Dr Rolena Infante after 04/22/22

## 2022-04-25 ENCOUNTER — Ambulatory Visit: Payer: Self-pay

## 2022-04-25 ENCOUNTER — Encounter: Payer: Self-pay | Admitting: Sports Medicine

## 2022-04-25 ENCOUNTER — Ambulatory Visit (INDEPENDENT_AMBULATORY_CARE_PROVIDER_SITE_OTHER): Payer: 59 | Admitting: Sports Medicine

## 2022-04-25 DIAGNOSIS — S46812A Strain of other muscles, fascia and tendons at shoulder and upper arm level, left arm, initial encounter: Secondary | ICD-10-CM | POA: Diagnosis not present

## 2022-04-25 DIAGNOSIS — M7542 Impingement syndrome of left shoulder: Secondary | ICD-10-CM | POA: Diagnosis not present

## 2022-04-25 DIAGNOSIS — M75102 Unspecified rotator cuff tear or rupture of left shoulder, not specified as traumatic: Secondary | ICD-10-CM

## 2022-04-25 DIAGNOSIS — M25512 Pain in left shoulder: Secondary | ICD-10-CM

## 2022-04-25 MED ORDER — BUPIVACAINE HCL 0.25 % IJ SOLN
2.0000 mL | INTRAMUSCULAR | Status: AC | PRN
  Administered 2022-04-25: 2 mL via INTRA_ARTICULAR

## 2022-04-25 MED ORDER — BETAMETHASONE SOD PHOS & ACET 6 (3-3) MG/ML IJ SUSP
6.0000 mg | INTRAMUSCULAR | Status: AC | PRN
  Administered 2022-04-25: 6 mg via INTRA_ARTICULAR

## 2022-04-25 MED ORDER — LIDOCAINE HCL 1 % IJ SOLN
2.0000 mL | INTRAMUSCULAR | Status: AC | PRN
  Administered 2022-04-25: 2 mL

## 2022-04-25 MED ORDER — MELOXICAM 15 MG PO TABS
15.0000 mg | ORAL_TABLET | Freq: Every day | ORAL | 1 refills | Status: DC
Start: 2022-04-25 — End: 2022-12-18

## 2022-04-25 NOTE — Patient Instructions (Signed)
-   Corticosteroid injection today for shoulder  -1 week of limited use of the arm with no physical activity, lifting, baseball etc. -After 1 week, may start getting back into drills, fielding, soft toss but would avoid activities that reproduce your pain such as hard swinging/hitting  -After 2 weeks, no restrictions from the injection standpoint.  May get back as pain allows  -Recommend continuing physical activity for impingement syndrome  - Plan for PRP in November time when you can take 2-4 weeks

## 2022-04-25 NOTE — Progress Notes (Signed)
[facility] - 21 y.o. male MRN [id number]  Date of birth: August 27, 2000  Office Visit Note: Visit Date: 04/25/2022 PCP: Conception Oms, MD Referred by: Conception Oms, *  Subjective: CC: Left shoulder pain  HPI: [facility] is a pleasant 21 y.o. male who presents today for left shoulder pain.  He is a Furniture conservator/restorer.  He has had left lateral shoulder pain for the last month.  He was taking batting practice around 03/30/2022 when he followed through and felt a sharp pain over the lateral aspect of the shoulder.  He has tried taking some time off and doing activity modification without any relief.  He has been taking ibuprofen and did do a course of Medrol Dosepak without much improvement.  He has worked through dry needling and rotator cuff rehab with his trainer but his pain has still persisted.  Recently underwent MRI of the shoulder, see results below.  Pertinent ROS were reviewed with the patient and found to be negative unless otherwise specified above in HPI.   Assessment & Plan: Visit Diagnoses:  1. Acute pain of left shoulder   2. Impingement syndrome of left shoulder   3. Tear of left supraspinatus tendon   4. Tear of left infraspinatus tendon, initial encounter    Plan: Had a lengthy discussion with [facility] regarding the nature of his injury and treatment course.  He does have signs and symptoms as well as an MRI which shows signs of internal impingement.  He does have subchondral cystic changes within the humeral head as well as some partial articular sided tearing of the supraspinatus and infraspinatus.  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  He is not at a point where he can simply take off from playing.  Through shared decision making, elected to proceed with shoulder injection, peppering around the supra and infraspinatus proximal tendons.  He will refrain from all activity for 1 week, second week will  begin back with drills, but abstaining from full play until after the 2-week mark.  We will start him on some meloxicam 15 mg to be taken once daily for pain control.  Did give him home rehab exercises for shoulder impingement which she will work with his Product/process development scientist.  He will keep me updated how this is helping proving his pain.  We did discuss that would not proceed with additional corticosteroid injections, but actually an injection like PRP would be more efficacious for him going forward.  He has some time off at the end of November and December, we may plan on doing a PRP injection near the articular tearing of the supraspinatus and infraspinatus when he has time off.  Follow-up: in Training Room; f/u in November to discuss PRP   Meds & Orders:  Meds ordered this encounter  Medications   meloxicam (MOBIC) 15 MG tablet    Sig: Take 1 tablet (15 mg total) by mouth daily.    Dispense:  30 tablet    Refill:  1    Orders Placed This Encounter  Procedures   Large Joint Inj   US Guided Needle Placement - No Linked Charges     Procedures: Large Joint Inj: L subacromial bursa on 04/25/2022 4:12 PM Details: 22 G 1.5 in needle, ultrasound-guided lateral approach Medications: 2 mL lidocaine 1 %; 2 mL bupivacaine 0.25 %; 6 mg betamethasone acetate-betamethasone sodium phosphate 6 (3-3) MG/ML  US-Guided  Subacromial Injection, Left Shoulder  After discussion on risks/benefits/indications, informed verbal consent was obtained. A timeout was then performed. Patient was seated on table in exam room. The patient's shoulder was prepped with betadine and alcohol swabs and utilizing lateral approach with ultrasound guidance, the patient's subacromial space was injected with 2:2:1 mixture of lidocaine:bupivicaine:celestone via an in-plane approach. Approximately 1/4 of injectate was peppered around the supraspinatus/infraspinatus proximal tendon off GT. Patient tolerated the procedure well without  immediate complications.   Procedure, treatment alternatives, risks and benefits explained, specific risks discussed. Consent was given by the patient. Immediately prior to procedure a time out was called to verify the correct patient, procedure, equipment, support staff and site/side marked as required. Patient was prepped and draped in the usual sterile fashion.          Clinical History: No specialty comments available.  He reports that he has never smoked. He has never used smokeless tobacco. No results for input(s): "HGBA1C", "LABURIC" in the last 8760 hours.  Objective:    Physical Exam  Gen: Well-appearing, in no acute distress; non-toxic CV: Regular Rate. Well-perfused. Warm.  Resp: Breathing unlabored on room air; no wheezing. Psych: Fluid speech in conversation; appropriate affect; normal thought process Neuro: Sensation intact throughout. No gross coordination deficits.   Ortho Exam -Evaluation of the left shoulder demonstrates no swelling or erythema.  There is TTP just off the distal acromion of the proximal lateral humeral head.  There is full active and passive range of motion in all directions although pain with endrange external rotation and lateral abduction.  Positive empty can test and pain with resisted external rotation.  Negative O'Brien's testing.  There is some pain with resisted abduction and ER. + Hawkins impingement testing.  Imaging:  MR Shoulder Left w/ contrast CLINICAL DATA:  Posterolateral shoulder pain. Injured shoulder in March of 2023 with apparent dislocation.  EXAM: MRI OF THE LEFT SHOULDER WITH CONTRAST  TECHNIQUE: Multiplanar, multisequence MR imaging of the left shoulder was performed following the administration of intra-articular contrast.  CONTRAST:  See Injection Documentation.  COMPARISON:  None  FINDINGS: Rotator cuff: Moderate thinning of the supraspinatus and infraspinatus tendons. There is a partial thickness bursal  surface tear involving the supraspinatus tendon which begins in the critical zone region and extends back into the musculotendinous junction. Bursal fluid enters the tear and is dissecting back toward the muscle.  Bursal surface irregularity with fraying and fibrillation involving the attachment fibers of the infraspinatus tendon. The subscapularis tendon is intact.  Muscles: No significant findings.  Biceps long head: Intact  Acromioclavicular Joint: Normal. The acromion is type 2 in shape. No lateral downsloping or subacromial spurring.  Glenohumeral Joint: Normal articular cartilage.  No loose bodies.  Labrum: No labral tears are identified. The glenohumeral ligaments are intact. The patient apparently was unable to do the ABER sequence.  Bones: Clustered subchondral cystic type changes involving the humeral head near the infraspinatus and supraspinatus attachment sites typically seen with impingement in the overhead throwing athletes.  IMPRESSION: 1. Moderate thinning of the supraspinatus and infraspinatus tendons. There is a partial thickness bursal surface tear involving the supraspinatus tendon which begins in the critical zone region and extends back into the musculotendinous junction. No full-thickness retracted rotator cuff tear. 2. Bursal surface irregularity with fraying and fibrillation involving the attachment fibers of the infraspinatus tendon. 3. Intact long head biceps tendon, glenoid labrum and glenohumeral ligaments. 4. Clustered subchondral cystic type changes involving the humeral head near the infraspinatus  and supraspinatus attachment sites typically seen with impingement in the overhead throwing athletes.  Electronically Signed   By: Rudie Meyer M.D.   On: 04/22/2022 16:55 DG FLUORO GUIDED NEEDLE PLC ASPIRATION/INJECTION LOC CLINICAL DATA:  Left shoulder pain.  EXAM: LEFT SHOULDER INJECTION UNDER FLUOROSCOPY  TECHNIQUE: An appropriate skin  entrance site was determined. The site was marked, prepped with Betadine, draped in the usual sterile fashion, and infiltrated locally with 1% lidocaine. A 22 gauge spinal needle was advanced to the superomedial margin of the humeral head under intermittent fluoroscopy. 1 mL of 1% lidocaine injected easily. A mixture of 0.1 mL of MultiHance, 15 mL of Isovue-M 200, and 5 mL of sterile saline was then used to opacify the left shoulder capsule. 12 mL of this mixture were injected. There was no immediate complication.  FLUOROSCOPY: Radiation Exposure Index (as provided by the fluoroscopic device): 0.10 mGy Kerma  IMPRESSION: Technically successful left shoulder injection for MRI.  Electronically Signed   By: Sebastian Ache M.D.   On: 04/22/2022 16:15    Past Medical/Family/Surgical/Social History: Medications & Allergies reviewed per EMR, new medications updated. There are no problems to display for this patient.  No past medical history on file. No family history on file. No past surgical history on file. Social History   Occupational History   Not on file  Tobacco Use   Smoking status: Never   Smokeless tobacco: Never  Substance and Sexual Activity   Alcohol use: Yes   Drug use: Not on file   Sexual activity: Not on file

## 2022-05-22 ENCOUNTER — Ambulatory Visit: Payer: 59 | Admitting: Sports Medicine

## 2022-06-05 ENCOUNTER — Ambulatory Visit: Payer: Self-pay

## 2022-06-05 ENCOUNTER — Ambulatory Visit (INDEPENDENT_AMBULATORY_CARE_PROVIDER_SITE_OTHER): Payer: Self-pay | Admitting: Sports Medicine

## 2022-06-05 DIAGNOSIS — M25512 Pain in left shoulder: Secondary | ICD-10-CM

## 2022-06-05 DIAGNOSIS — M7542 Impingement syndrome of left shoulder: Secondary | ICD-10-CM

## 2022-06-05 DIAGNOSIS — S46812D Strain of other muscles, fascia and tendons at shoulder and upper arm level, left arm, subsequent encounter: Secondary | ICD-10-CM

## 2022-06-05 DIAGNOSIS — M75102 Unspecified rotator cuff tear or rupture of left shoulder, not specified as traumatic: Secondary | ICD-10-CM

## 2022-06-05 NOTE — Patient Instructions (Addendum)
Dr. Shon Baton' Instructions and What to Expect for PRP Injections:  Platelet-rich plasma is used in musculoskeletal medicine to focus your own body's ability to heal. It has several well-done published randomized control trials (RCT) which demonstrate both its effectiveness and safety in many musculoskeletal conditions, including osteoarthritis, tendinopathies, and damaged vertebral discs. PRP has been in clinical use since the 1990's. Many people know that platelets form a clot if there is a cut in the skin. It turns out that platelets do not only form a clot, they also start the body's own repair process. When platelets activate to form a clot, they also release alpha granules which have hundreds of chemical messengers in them that initiate and organize repair to the damaged tissue. Precisely placing PRP into the site of injury will initiate the healing process by activating on the damaged cartilage or tendon. This is an inflammatory process, and inflammation is the vital first phase of Healing.  What to do after your procedure  Avoid NSAIDs like ibuprofen, mobic, aleve for 2 weeks No ice for 2 weeks. Acetaminophen can be used for mild pain.  No throwing, hitting, or any upper extremity exercise/lifting for 2 weeks. We will see each other in training room in 2 weeks and discuss return-to-play and activity guidelines. Do your best not to tense or load the treated area during this time. After 3 days, unless otherwise instructed, the treated body part should be used and slowly moved through its full range of motion. It will be sore, but you will not be doing damage by moving it, in fact it needs to move to heal. I For now, avoid activities that specifically hurt you before being treated. Exercise is vital to good health and finding a way to cross train around your injury is important not only for your physical health, but for your mental health as well. Ask me about cross training options for  your injury.  Improvements in pain and function should be expected from 8 weeks to 12 weeks after injection and some injuries may require more than one treatment.

## 2022-06-05 NOTE — Progress Notes (Signed)
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  See MRI report below.  MR Shoulder Left w/ contrast CLINICAL DATA:  Posterolateral shoulder pain. Injured shoulder in March of 2023 with apparent dislocation.  EXAM: MRI OF THE LEFT SHOULDER WITH CONTRAST  TECHNIQUE: Multiplanar, multisequence MR imaging of the left shoulder was performed following the administration of intra-articular contrast.  CONTRAST:  See Injection Documentation.  COMPARISON:  None  FINDINGS: Rotator cuff: Moderate thinning of the supraspinatus and infraspinatus tendons. There is a partial thickness bursal surface tear involving the supraspinatus tendon which begins in the critical zone region and extends back into the musculotendinous junction. Bursal fluid enters the tear and is dissecting back toward the muscle.  Bursal surface irregularity with fraying and fibrillation involving the attachment fibers of the infraspinatus tendon. The subscapularis tendon is intact.  Muscles: No significant findings.  Biceps long head: Intact  Acromioclavicular Joint: Normal. The acromion is type 2 in shape. No lateral downsloping or subacromial spurring.  Glenohumeral Joint: Normal articular cartilage.  No loose bodies.  Labrum: No labral tears are identified. The glenohumeral ligaments are intact. The patient apparently was unable to do the ABER sequence.  Bones: Clustered subchondral cystic type changes involving the humeral head near the infraspinatus and supraspinatus attachment sites typically seen with impingement in the overhead throwing athletes.  IMPRESSION: 1. Moderate thinning of the supraspinatus and infraspinatus tendons. There is a partial thickness  bursal surface tear involving the supraspinatus tendon which begins in the critical zone region and extends back into the musculotendinous junction. No full-thickness retracted rotator cuff tear. 2. Bursal surface irregularity with fraying and fibrillation involving the attachment fibers of the infraspinatus tendon. 3. Intact long head biceps tendon, glenoid labrum and glenohumeral ligaments. 4. Clustered subchondral cystic type changes involving the humeral head near the infraspinatus and supraspinatus attachment sites typically seen with impingement in the overhead throwing athletes.  Electronically Signed   By: Marijo Sanes M.D.   On: 04/22/2022 16:55 DG FLUORO GUIDED NEEDLE PLC ASPIRATION/INJECTION LOC CLINICAL DATA:  Left shoulder pain.  EXAM: LEFT SHOULDER INJECTION UNDER FLUOROSCOPY  TECHNIQUE: An appropriate skin entrance site was determined. The site was marked, prepped with Betadine, draped in the usual sterile fashion, and infiltrated locally with 1% lidocaine. A 22 gauge spinal needle was advanced to the superomedial margin of the humeral head under intermittent fluoroscopy. 1 mL of 1% lidocaine injected easily. A mixture of 0.1 mL of MultiHance, 15 mL of Isovue-M 200, and 5 mL of sterile saline was then used to opacify the left shoulder capsule. 12 mL of this mixture were injected. There was no immediate complication.  FLUOROSCOPY: Radiation Exposure Index (as provided by the fluoroscopic device): 0.10 mGy Kerma  IMPRESSION: Technically successful left shoulder injection for MRI.  Electronically Signed   By: Logan Bores M.D.   On: 04/22/2022 16:15  Procedures: Visit Diagnoses:  1. Impingement syndrome of left shoulder   2. Acute pain of left shoulder   3. Tear of left infraspinatus tendon, subsequent encounter   4. Tear of left supraspinatus tendon  Ultrasound-guided supraspinatus/infraspinatus Injection, Intra-tendinous, left: After  discussion on risks/benefits/indications, informed verbal consent was obtained and a timeout was performed. The patient was lying lateral recumbent on exam table with the affected shoulder facing the ceiling.  The overlying shoulder was then prepped with ChloraPrep and multiple alcohol swabs.  Utilizing ultrasound guidance with the probe and in oblique position, the patient's supraspinatus and infraspinatus was identified and the surrounding soft tissue area only was anesthesized with a 25-gauge, 1.5 inch needle with approximately 3 cc of lidocaine 1% using sterile technique, but none of the anesthetic was delivered into or around the rotator cuff tendons.  Following this, using ultrasound guidance via an in-plane approach, a 22-gauge, 1.5 inch needle was directed into the rotator cuff footprint of the overlying supraspinatus and infraspinatus tendons near the area of tearing and was subsequently injected with approximately 5 cc of platelet-rich-plasma (leukocyte-rich).  Using ultrasound visualization, the needle and PRP injectate was then peppered around both the supraspinatus and infraspinatus, bursal sided tendons with visualization of injectate spread within the damaged tendon regions.  Patient tolerated the procedure well without immediate complications.  Kit: RegenLab: RegenCell; THT-3 kit   *Post-PRP Injection Guidelines: No anti-inflammatories (ibuprofen/motrin, aleve, meloxicam, etc.) for 2 weeks.  No ice for 2 weeks. Short prescription of tramadol may be written if pain is severe, call if needed. Appropriate rest / bracing / and timeframe for beginning therapy discussed and patient endorsed understanding. Patient may use a sling for the left shoulder for the first 2 days if pain is bothersome, but will wean out of this by day 3 if required.   - I would like him to abstain from any upper extremity exercise, hitting/throwing, or lifting for 2 weeks. May do core and lower extremity with arm at 90  degrees or below. I will then see him in the training room at that time and we will discuss RTP protocol and next steps.  Elba Barman, DO Primary Care Sports Medicine Physician  [facility]  This note was dictated using Dragon naturally speaking software and may contain errors in syntax, spelling, or content which have not been identified prior to signing this note.

## 2022-06-06 ENCOUNTER — Encounter: Payer: Self-pay | Admitting: Sports Medicine

## 2022-10-20 ENCOUNTER — Ambulatory Visit (INDEPENDENT_AMBULATORY_CARE_PROVIDER_SITE_OTHER): Payer: Self-pay | Admitting: Orthopedic Surgery

## 2022-10-20 DIAGNOSIS — S81012S Laceration without foreign body, left knee, sequela: Secondary | ICD-10-CM

## 2022-10-26 ENCOUNTER — Encounter: Payer: Self-pay | Admitting: Orthopedic Surgery

## 2022-10-26 NOTE — Progress Notes (Signed)
   Post-Op Visit Note   Patient: [facility]           Date of Birth: [birth year]           MRN: [id number] Visit Date: 10/20/2022 PCP: Conception Oms, MD   Assessment & Plan:  Chief Complaint: No chief complaint on file.  Visit Diagnoses:  1. 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  He had a "strawberry" on the knee prior to his injury when he was participating in a game.  On examination he does have about a 2 cm laceration on top of prior granulation tissue.  This area is anesthetized with plain Marcaine and thoroughly irrigated both with saline as well as alcohol peripherally.  3 sutures of nylon sutures placed after debriding some of the nonviable granulomatous tissue.  All in all a loose closure was achieved.  He does have some risk of becoming infected despite extensive washout.  Impervious dressing applied which should be changed at least daily or every other day just so we can keep looking at the incision.  Based on its location around the extensor mechanism distally he would be advised to leave the sutures in for at least 10 days.  Follow-up at that time for evaluation and possible suture removal but not before 10 days.  Follow-Up Instructions: No follow-ups on file.   Orders:  No orders of the defined types were placed in this encounter.  No orders of the defined types were placed in this encounter.   Imaging: No results found.  PMFS History: There are no problems to display for this patient.  No past medical history on file.  No family history on file.  No past surgical history on file. Social History   Occupational History   Not on file  Tobacco Use   Smoking status: Never   Smokeless tobacco: Never  Substance and Sexual Activity   Alcohol use: Yes   Drug use: Not on file   Sexual activity: Not on file

## 2022-12-08 ENCOUNTER — Ambulatory Visit (INDEPENDENT_AMBULATORY_CARE_PROVIDER_SITE_OTHER): Payer: Managed Care, Other (non HMO)

## 2022-12-08 ENCOUNTER — Encounter (HOSPITAL_BASED_OUTPATIENT_CLINIC_OR_DEPARTMENT_OTHER): Payer: Self-pay | Admitting: Student

## 2022-12-08 ENCOUNTER — Ambulatory Visit (INDEPENDENT_AMBULATORY_CARE_PROVIDER_SITE_OTHER): Payer: Managed Care, Other (non HMO) | Admitting: Student

## 2022-12-08 DIAGNOSIS — M25532 Pain in left wrist: Secondary | ICD-10-CM

## 2022-12-08 DIAGNOSIS — S6990XA Unspecified injury of unspecified wrist, hand and finger(s), initial encounter: Secondary | ICD-10-CM | POA: Diagnosis not present

## 2022-12-08 DIAGNOSIS — M25522 Pain in left elbow: Secondary | ICD-10-CM

## 2022-12-08 NOTE — Progress Notes (Unsigned)
Chief Complaint: Left wrist, thumb, and elbow pain     History of Present Illness:    [facility] is a 22 y.o. male presenting to clinic for evaluation of left wrist and elbow pain.  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  He reports that his left wrist was immediately painful and his left thumb was dislocated, which was then reduced on the field.  His thumb felt immediately better however shortly after he did begin to notice pain and swelling of his left elbow.  X-rays taken onsite suggested a radial styloid fracture and he was placed in a wrist splint.  He does note some slight tingling in his thumb and pointer finger.  He is right-hand dominant.  He is here today for further assessment.   Surgical History:   None  PMH/PSH/Family History/Social History/Meds/Allergies:   History reviewed. No pertinent past medical history. History reviewed. No pertinent surgical history. Social History   Socioeconomic History   Marital status: Single    Spouse name: Not on file   Number of children: Not on file   Years of education: Not on file   Highest education level: Not on file  Occupational History   Not on file  Tobacco Use   Smoking status: Never   Smokeless tobacco: Never  Substance and Sexual Activity   Alcohol use: Yes   Drug use: Not on file   Sexual activity: Not on file  Other Topics Concern   Not on file  Social History Narrative   Not on file   Social Determinants of Health   Financial Resource Strain: Not on file  Food Insecurity: Not on file  Transportation Needs: Not on file  Physical Activity: Not on file  Stress: Not on file  Social Connections: Not on file   History reviewed. No pertinent family history. No Known Allergies Current Outpatient Medications  Medication Sig Dispense Refill   ibuprofen (ADVIL) 200 MG tablet Take 200 mg by mouth every 6 (six) hours as needed.      meloxicam (MOBIC) 15 MG tablet Take 1 tablet (15 mg total) by mouth daily. (Patient not taking: Reported on 12/08/2022) 30 tablet 1   No current facility-administered medications for this visit.   No results found.  Review of Systems:   A ROS was performed including pertinent positives and negatives as documented in the HPI.  Physical Exam :   Constitutional: NAD and appears stated age Neurological: Alert and oriented Psych: Appropriate affect and cooperative There were no vitals taken for this visit.   Comprehensive Musculoskeletal Exam:    Some tenderness to palpation of the left first MCP joint.  Flexion and extension of the first MCP and DIP joints is intact.  Tenderness over distal radius. No anatomical snuffbox tenderness.  Active wrist range of motion to 45 degrees flexion and 15 degrees extension.  Radial pulse 2+.  Tenderness at the left elbow over medial epicondyle.  Active elbow range of motion to 20 degrees extension and 100 degrees flexion.  Distal neurosensory exam otherwise intact.  Imaging:   Xray (Left wrist 3 views): Nondisplaced radial styloid fracture.  Volar plate avulsion of the first MCP joint.  Xray (Left elbow 2 views): Negative  I personally reviewed and interpreted the radiographs.  Assessment:   22 y.o. male with left elbow, wrist, and thumb pain after a collision injury in a baseball game 3 days ago.  He does have a volar plate fracture of the first MCP joint which could require surgical intervention.  I will get him referred to hand surgery for further workup and treatment.  His radial styloid fracture is nondisplaced and should likely heal with conservative management.  He was placed in a short arm cast today.  At this point I would recommend addressing the thumb and wrist fractures and monitoring the elbow pain for a few weeks to see if it improves with ice and anti-inflammatories.  We can plan to follow up on the wrist in the elbow in about 4  weeks.  Plan :    - Referral to orthopedic hand surgeon Dr. Yehuda Budd for evaluation of MCP avulsion fracture - Placed in short arm cast for radial styloid fracture - Return to clinic in 4 weeks for reevaluation of left elbow if symptoms persist     I personally saw and evaluated the patient, and participated in the management and treatment plan.  Hazle Nordmann, PA-C Orthopedics  This document was dictated using Conservation officer, historic buildings. A reasonable attempt at proof reading has been made to minimize errors.

## 2022-12-18 ENCOUNTER — Encounter (HOSPITAL_BASED_OUTPATIENT_CLINIC_OR_DEPARTMENT_OTHER): Payer: Self-pay | Admitting: Orthopedic Surgery

## 2022-12-18 ENCOUNTER — Other Ambulatory Visit: Payer: Self-pay

## 2022-12-23 NOTE — Anesthesia Preprocedure Evaluation (Signed)
Anesthesia Evaluation  Patient identified by MRN, date of birth, ID band Patient awake    Reviewed: Allergy & Precautions, NPO status , Patient's Chart, lab work & pertinent test results  Airway Mallampati: II  TM Distance: >3 FB Neck ROM: Full    Dental  (+) Teeth Intact, Dental Advisory Given   Pulmonary neg pulmonary ROS   Pulmonary exam normal breath sounds clear to auscultation       Cardiovascular negative cardio ROS Normal cardiovascular exam Rhythm:Regular Rate:Normal     Neuro/Psych negative neurological ROS     GI/Hepatic negative GI ROS, Neg liver ROS,,,  Endo/Other  negative endocrine ROS    Renal/GU negative Renal ROS     Musculoskeletal Left radial styloid fracture, left thumb ulnar collateral ligament avulsion fracture   Abdominal   Peds  Hematology negative hematology ROS (+)   Anesthesia Other Findings   Reproductive/Obstetrics                             Anesthesia Physical Anesthesia Plan  ASA: 1  Anesthesia Plan: Regional and MAC   Post-op Pain Management: Regional block* and Tylenol PO (pre-op)*   Induction: Intravenous  PONV Risk Score and Plan: 2 and TIVA, Midazolam, Dexamethasone and Ondansetron  Airway Management Planned: Natural Airway and Simple Face Mask  Additional Equipment:   Intra-op Plan:   Post-operative Plan:   Informed Consent: I have reviewed the patients History and Physical, chart, labs and discussed the procedure including the risks, benefits and alternatives for the proposed anesthesia with the patient or authorized representative who has indicated his/her understanding and acceptance.     Dental advisory given  Plan Discussed with: CRNA  Anesthesia Plan Comments:        Anesthesia Quick Evaluation

## 2022-12-24 ENCOUNTER — Ambulatory Visit (HOSPITAL_BASED_OUTPATIENT_CLINIC_OR_DEPARTMENT_OTHER)
Admission: RE | Admit: 2022-12-24 | Discharge: 2022-12-24 | Disposition: A | Discharge status: Home / Self Care | Payer: Managed Care, Other (non HMO) | Attending: Orthopedic Surgery | Admitting: Orthopedic Surgery

## 2022-12-24 ENCOUNTER — Ambulatory Visit (HOSPITAL_BASED_OUTPATIENT_CLINIC_OR_DEPARTMENT_OTHER): Payer: Managed Care, Other (non HMO) | Admitting: Certified Registered"

## 2022-12-24 ENCOUNTER — Ambulatory Visit (HOSPITAL_BASED_OUTPATIENT_CLINIC_OR_DEPARTMENT_OTHER): Payer: Managed Care, Other (non HMO)

## 2022-12-24 ENCOUNTER — Other Ambulatory Visit: Payer: Self-pay

## 2022-12-24 ENCOUNTER — Encounter (HOSPITAL_BASED_OUTPATIENT_CLINIC_OR_DEPARTMENT_OTHER)
Admission: RE | Disposition: A | Discharge status: Home / Self Care | Payer: Self-pay | Source: Home / Self Care | Attending: Orthopedic Surgery

## 2022-12-24 ENCOUNTER — Encounter (HOSPITAL_BASED_OUTPATIENT_CLINIC_OR_DEPARTMENT_OTHER): Payer: Self-pay | Admitting: Orthopedic Surgery

## 2022-12-24 DIAGNOSIS — S52515A Nondisplaced fracture of left radial styloid process, initial encounter for closed fracture: Secondary | ICD-10-CM | POA: Insufficient documentation

## 2022-12-24 DIAGNOSIS — S62512A Displaced fracture of proximal phalanx of left thumb, initial encounter for closed fracture: Secondary | ICD-10-CM | POA: Insufficient documentation

## 2022-12-24 DIAGNOSIS — Y9364 Activity, baseball: Secondary | ICD-10-CM | POA: Diagnosis not present

## 2022-12-24 DIAGNOSIS — S52512D Displaced fracture of left radial styloid process, subsequent encounter for closed fracture with routine healing: Secondary | ICD-10-CM

## 2022-12-24 DIAGNOSIS — S5332XD Traumatic rupture of left ulnar collateral ligament, subsequent encounter: Secondary | ICD-10-CM

## 2022-12-24 DIAGNOSIS — W51XXXA Accidental striking against or bumped into by another person, initial encounter: Secondary | ICD-10-CM | POA: Insufficient documentation

## 2022-12-24 HISTORY — DX: Other specified health status: Z78.9

## 2022-12-24 HISTORY — PX: ORIF WRIST FRACTURE: SHX2133

## 2022-12-24 SURGERY — OPEN REDUCTION INTERNAL FIXATION (ORIF) WRIST FRACTURE
Anesthesia: Monitor Anesthesia Care | Site: Wrist | Laterality: Left

## 2022-12-24 MED ORDER — BUPIVACAINE HCL (PF) 0.5 % IJ SOLN
INTRAMUSCULAR | Status: AC
  Filled 2022-12-24: qty 30

## 2022-12-24 MED ORDER — ONDANSETRON HCL 4 MG/2ML IJ SOLN
INTRAMUSCULAR | Status: DC | PRN
  Administered 2022-12-24: 4 mg via INTRAVENOUS

## 2022-12-24 MED ORDER — PROMETHAZINE HCL 25 MG/ML IJ SOLN: 6.2500 mg | INTRAMUSCULAR | Status: DC | PRN

## 2022-12-24 MED ORDER — FENTANYL CITRATE (PF) 100 MCG/2ML IJ SOLN
INTRAMUSCULAR | Status: AC
  Filled 2022-12-24: qty 2

## 2022-12-24 MED ORDER — CEFAZOLIN SODIUM-DEXTROSE 2-4 GM/100ML-% IV SOLN
2.0000 g | INTRAVENOUS | Status: AC
  Administered 2022-12-24: 2 g via INTRAVENOUS

## 2022-12-24 MED ORDER — LIDOCAINE-EPINEPHRINE (PF) 1.5 %-1:200000 IJ SOLN
INTRAMUSCULAR | Status: DC | PRN
  Administered 2022-12-24: 5 mL

## 2022-12-24 MED ORDER — FENTANYL CITRATE (PF) 100 MCG/2ML IJ SOLN
100.0000 ug | Freq: Once | INTRAMUSCULAR | Status: AC
  Administered 2022-12-24: 100 ug via INTRAVENOUS

## 2022-12-24 MED ORDER — GABAPENTIN 300 MG PO CAPS
ORAL_CAPSULE | ORAL | Status: AC
  Filled 2022-12-24: qty 1

## 2022-12-24 MED ORDER — GABAPENTIN 300 MG PO CAPS
300.0000 mg | ORAL_CAPSULE | Freq: Once | ORAL | Status: AC
  Administered 2022-12-24: 300 mg via ORAL

## 2022-12-24 MED ORDER — PROPOFOL 500 MG/50ML IV EMUL
INTRAVENOUS | Status: DC | PRN
  Administered 2022-12-24: 100 ug/kg/min via INTRAVENOUS

## 2022-12-24 MED ORDER — FENTANYL CITRATE (PF) 100 MCG/2ML IJ SOLN: 25.0000 ug | INTRAMUSCULAR | Status: DC | PRN

## 2022-12-24 MED ORDER — MIDAZOLAM HCL 2 MG/2ML IJ SOLN
2.0000 mg | Freq: Once | INTRAMUSCULAR | Status: AC
  Administered 2022-12-24: 2 mg via INTRAVENOUS

## 2022-12-24 MED ORDER — BUPIVACAINE HCL (PF) 0.25 % IJ SOLN
INTRAMUSCULAR | Status: AC
  Filled 2022-12-24: qty 60

## 2022-12-24 MED ORDER — OXYCODONE HCL 5 MG PO TABS
ORAL_TABLET | ORAL | Status: AC
  Filled 2022-12-24: qty 1

## 2022-12-24 MED ORDER — ACETAMINOPHEN 500 MG PO TABS
1000.0000 mg | ORAL_TABLET | Freq: Once | ORAL | Status: AC
  Administered 2022-12-24: 1000 mg via ORAL

## 2022-12-24 MED ORDER — LACTATED RINGERS IV SOLN: INTRAVENOUS | Status: DC

## 2022-12-24 MED ORDER — LIDOCAINE HCL (PF) 1 % IJ SOLN
INTRAMUSCULAR | Status: AC
  Filled 2022-12-24: qty 60

## 2022-12-24 MED ORDER — ROPIVACAINE HCL 5 MG/ML IJ SOLN
INTRAMUSCULAR | Status: DC | PRN
  Administered 2022-12-24: 30 mL via PERINEURAL

## 2022-12-24 MED ORDER — MIDAZOLAM HCL 2 MG/2ML IJ SOLN
INTRAMUSCULAR | Status: AC
  Filled 2022-12-24: qty 2

## 2022-12-24 MED ORDER — ONDANSETRON HCL 4 MG/2ML IJ SOLN
INTRAMUSCULAR | Status: AC
  Filled 2022-12-24: qty 2

## 2022-12-24 MED ORDER — PROPOFOL 10 MG/ML IV BOLUS
INTRAVENOUS | Status: DC | PRN
  Administered 2022-12-24: 20 mg via INTRAVENOUS
  Administered 2022-12-24: 10 mg via INTRAVENOUS

## 2022-12-24 MED ORDER — DEXAMETHASONE SODIUM PHOSPHATE 10 MG/ML IJ SOLN
INTRAMUSCULAR | Status: DC | PRN
  Administered 2022-12-24: 10 mg

## 2022-12-24 MED ORDER — ACETAMINOPHEN 500 MG PO TABS
ORAL_TABLET | ORAL | Status: AC
  Filled 2022-12-24: qty 2

## 2022-12-24 MED ORDER — PROPOFOL 10 MG/ML IV BOLUS
INTRAVENOUS | Status: AC
  Filled 2022-12-24: qty 20

## 2022-12-24 MED ORDER — OXYCODONE HCL 5 MG PO TABS: 5.0000 mg | ORAL_TABLET | Freq: Four times a day (QID) | ORAL | 0 refills | Status: AC | PRN

## 2022-12-24 MED ORDER — CEFAZOLIN SODIUM-DEXTROSE 2-4 GM/100ML-% IV SOLN
INTRAVENOUS | Status: AC
  Filled 2022-12-24: qty 100

## 2022-12-24 MED ORDER — OXYCODONE HCL 5 MG PO TABS
5.0000 mg | ORAL_TABLET | Freq: Once | ORAL | Status: AC
  Administered 2022-12-24: 5 mg via ORAL

## 2022-12-24 SURGICAL SUPPLY — 46 items
APL PRP STRL LF DISP 70% ISPRP (MISCELLANEOUS) ×1
BLADE SURG 15 STRL LF DISP TIS (BLADE) ×1 IMPLANT
BLADE SURG 15 STRL SS (BLADE) ×2
BNDG CMPR 5X3 KNIT ELC UNQ LF (GAUZE/BANDAGES/DRESSINGS) ×1
BNDG CMPR 9X4 STRL LF SNTH (GAUZE/BANDAGES/DRESSINGS) ×1
BNDG ELASTIC 3INX 5YD STR LF (GAUZE/BANDAGES/DRESSINGS) ×1 IMPLANT
BNDG ESMARK 4X9 LF (GAUZE/BANDAGES/DRESSINGS) ×1 IMPLANT
BNDG GAUZE DERMACEA FLUFF 4 (GAUZE/BANDAGES/DRESSINGS) ×1 IMPLANT
BNDG GZE DERMACEA 4 6PLY (GAUZE/BANDAGES/DRESSINGS) ×1
BNDG PLASTER X FAST 3X3 WHT LF (CAST SUPPLIES) ×10 IMPLANT
BNDG PLSTR 9X3 FST ST WHT (CAST SUPPLIES) ×10
CHLORAPREP W/TINT 26 (MISCELLANEOUS) ×1 IMPLANT
CORD BIPOLAR FORCEPS 12FT (ELECTRODE) ×1 IMPLANT
COVER BACK TABLE 60X90IN (DRAPES) ×1 IMPLANT
COVER MAYO STAND STRL (DRAPES) ×1 IMPLANT
CUFF TOURN SGL QUICK 18X4 (TOURNIQUET CUFF) ×1 IMPLANT
CUFF TOURN SGL QUICK 24 (TOURNIQUET CUFF)
CUFF TRNQT CYL 24X4X16.5-23 (TOURNIQUET CUFF) IMPLANT
DRAPE EXTREMITY T 121X128X90 (DISPOSABLE) ×1 IMPLANT
DRAPE OEC MINIVIEW 54X84 (DRAPES) ×1 IMPLANT
DRAPE SURG 17X23 STRL (DRAPES) ×1 IMPLANT
GAUZE SPONGE 4X4 12PLY STRL (GAUZE/BANDAGES/DRESSINGS) ×1 IMPLANT
GAUZE XEROFORM 1X8 LF (GAUZE/BANDAGES/DRESSINGS) IMPLANT
GLOVE BIO SURGEON STRL SZ7 (GLOVE) ×1 IMPLANT
GOWN STRL REUS W/ TWL LRG LVL3 (GOWN DISPOSABLE) ×2 IMPLANT
GOWN STRL REUS W/TWL LRG LVL3 (GOWN DISPOSABLE) ×2
K-WIRE DBL .028X4 NSTRL (WIRE) ×1
K-WIRE DBL .035X4 NSTRL (WIRE) ×2
KWIRE DBL .028X4 NSTRL (WIRE) IMPLANT
KWIRE DBL .035X4 NSTRL (WIRE) IMPLANT
NDL HYPO 25X1 1.5 SAFETY (NEEDLE) IMPLANT
NEEDLE HYPO 25X1 1.5 SAFETY (NEEDLE) IMPLANT
NS IRRIG 1000ML POUR BTL (IV SOLUTION) ×1 IMPLANT
PACK BASIN DAY SURGERY FS (CUSTOM PROCEDURE TRAY) ×1 IMPLANT
PAD CAST 3X4 CTTN HI CHSV (CAST SUPPLIES) ×1 IMPLANT
PADDING CAST COTTON 3X4 STRL (CAST SUPPLIES) ×1
SLEEVE SCD COMPRESS KNEE MED (STOCKING) IMPLANT
SLING ARM FOAM STRAP XLG (SOFTGOODS) IMPLANT
SPLINT FIBERGLASS 4X30 (CAST SUPPLIES) IMPLANT
SUT ETHILON 4 0 PS 2 18 (SUTURE) ×1 IMPLANT
SUT MNCRL AB 3-0 PS2 18 (SUTURE) ×1 IMPLANT
SUT VIC AB 4-0 PS2 18 (SUTURE) IMPLANT
SYR BULB EAR ULCER 3OZ GRN STR (SYRINGE) ×1 IMPLANT
SYR CONTROL 10ML LL (SYRINGE) IMPLANT
TOWEL GREEN STERILE FF (TOWEL DISPOSABLE) ×2 IMPLANT
UNDERPAD 30X36 HEAVY ABSORB (UNDERPADS AND DIAPERS) ×1 IMPLANT

## 2022-12-24 NOTE — Op Note (Signed)
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  Patient and I reviewed the risks and benefits of both surgical and conservative management of his injuries with the patient electing to proceed with ORIF of the thumb UCL fracture with likely nonoperative management of the radial styloid given it's lack of displacement.  Risks, benefits, and alternatives to surgery were again discussed with the patient in the preoperative area. The patient wishes to proceed with surgery.  Informed consent was signed after our discussion.   PREOPERATIVE DIAGNOSIS:  Displaced left thumb UCL avulsion fracture Non displaced left radial styloid fracture  POSTOPERATIVE DIAGNOSIS: Same.  PROCEDURE:  Open reduction and internal fixation of intra-articular left thumb proximal phalanx fracture ([address]) Closed treatment of left radial styloid fracture ([address])   SURGEON: Waylan Rocher, M.D.  ASSIST: None  ANESTHESIA:  Regional, MAC  IV FLUIDS AND URINE: See anesthesia.  ESTIMATED BLOOD LOSS: <5 mL.  IMPLANTS: 0.035" k-wires x 2, 0.028" k-wire  DRAINS: None  COMPLICATIONS: None  DESCRIPTION OF PROCEDURE: The patient was met in the preoperative holding area where the surgical site was marked and the consent form was signed.  The patient was then taken to the operating room and remained on the stretcher.  A hand table was placed adjacent to the left upper extremity and locked into place.  All bony prominences were well padded.  A tourniquet was applied to the left upper arm.  Monitored sedation was induced.  The operative extremity was prepped and draped in the usual and sterile fashion.  A formal time-out was performed to confirm that this was the correct patient, surgery, side, and site.    Following formal timeout, the limb was gently exsanguinated with an Esmarch bandage and the tourniquet inflated to 250 mmHg.  I began by designing a lazy S shaped incision at the ulnar aspect of the thumb centered on the MCP joint.  The skin was incised.  Blunt dissection was used to develop full-thickness skin flaps.  A small crossing vessel was coagulated with bipolar cautery.  Small cutaneous nerves were identified and protected.  The adductor aponeurosis was identified.  The aponeurosis was split longitudinally.  The underlying ulnar collateral ligament was identified.  There was early soft callus formation at the fracture bed and around the small fracture fragment.  The fracture bed was debrided using a small curette and rondure to remove all early callus and soft tissue.  A 15 blade scalpel was used to free up the small fracture fragment with care taken to avoid stripping the collateral ligament off of the fragment.  The fragment was smaller than it appeared on fluoroscopy and as such I did not think that a screw would be a viable form of fixation.  The fracture fragment was then carefully reduced and held in place using a pointed reduction clamp.  AP and lateral views showed acceptable reduction of the fracture.  A small portion of the dorsal capsule was incised off of the dorsal base of the proximal phalanx to assess the articular reduction.  The articular reduction was acceptable.  There was a very small, 1 to 2 mm free chondral fragment that was removed.  A 0.035 inch K wire was then advanced from the corner of the small fragment across the fracture site exiting the radial shaft  of the proximal phalanx.  The K wire was in appropriate trajectory on both AP and lateral planes.  The wire was then advanced completely across the fracture site such that the wire could be removed from the radial aspect of the thumb in the office.  A second 0.035 inch K wire was then advanced in a similar fashion.  A third  0.028 inch K wire was then advanced in a similar fashion just distal to these 2 larger wires.  AP and lateral views showed that the fracture was acceptably reduced and the wires were in good position.  Again the articular surface appeared to be congruent despite the slight step-off visible at the ulnar metaphysis.  The wires were then cut and bent.  Repeat x-rays were used to confirm that the wires did not displace during this process.  I also reevaluated the radial styloid fracture which remained nondisplaced on all planes.  At this point, the wound was thoroughly irrigated with copious sterile saline.  It was then closed in a layered fashion.  The adductor aponeurosis was closed with a 4-0 Vicryl suture as was the defect in the MCP joint capsule.  The skin was closed using a 4-0 Monocryl suture in buried interrupted fashion.  This was followed by a 4-0 nylon suture in horizontal mattress fashion.  The tourniquet was deflated.  Hemostasis was achieved with direct pressure over the wound.  The thumb was pink, warm, and well-perfused at the end of the procedure.  The K wires were then dressed with Xeroform.  The wound was dressed with Xeroform, folded Kerlix, cast padding, and a well-padded thumb spica splint was applied to prevent range of motion both of the thumb and wrist.  The patient was reversed from sedation.  All counts were correct x 2 at the end of the procedure.  The patient was then taken to the PACU in stable condition.   POSTOPERATIVE PLAN: He will be discharged home with appropriate pain medication and discharge instructions.  I will see him back in 2 weeks at which point we will obtain repeat x-rays out of the splint.  I will then transition him to a short arm thumb spica cast.  Waylan Rocher, MD 9:20 AM

## 2022-12-24 NOTE — H&P (Signed)
  HAND SURGERY   HPI: Patient is a 22 y.o. male who presents with a left thumb UCL avulsion fracture and nondisplaced left radial styloid fracture after colliding with another player during a collegiate baseball game.  We discussed both surgical and nonsurgical treatment options, including the associated risks of each approach, in the office with the patient electing to proceed with surgery .  Patient denies any changes to their medical history or new systemic symptoms today.    Past Medical History:  Diagnosis Date   Medical history non-contributory    Past Surgical History:  Procedure Laterality Date   ELBOW SURGERY     WISDOM TOOTH EXTRACTION     Social History   Socioeconomic History   Marital status: Single    Spouse name: Not on file   Number of children: Not on file   Years of education: Not on file   Highest education level: Not on file  Occupational History   Not on file  Tobacco Use   Smoking status: Never   Smokeless tobacco: Never  Vaping Use   Vaping Use: Never used  Substance and Sexual Activity   Alcohol use: Yes    Comment: rare   Drug use: Never   Sexual activity: Not on file  Other Topics Concern   Not on file  Social History Narrative   Not on file   Social Determinants of Health   Financial Resource Strain: Not on file  Food Insecurity: Not on file  Transportation Needs: Not on file  Physical Activity: Not on file  Stress: Not on file  Social Connections: Not on file   History reviewed. No pertinent family history. - negative except otherwise stated in the family history section No Known Allergies Prior to Admission medications   Medication Sig Start Date End Date Taking? Authorizing Provider  ibuprofen (ADVIL) 200 MG tablet Take 200 mg by mouth every 6 (six) hours as needed.   Yes [provider]   No results found. - Positive ROS: All other systems have been reviewed and were otherwise negative with the exception of those  mentioned in the HPI and as above.  Physical Exam: General: No acute distress, resting comfortably Cardiovascular: BUE warm and well perfused, normal rate Respiratory: Normal WOB on RA Skin: Warm and dry Neurologic: Sensation intact distally Psychiatric: Patient is at baseline mood and affect  Left Upper Extremity  Splint clean and dry.  Full AROM of fingers.  Limited AROM of thumb secondary to splint immobilization.  SILT at exposed thumb tip.  Fingers warm and well perfused w/ BCR.     Assessment: 22 yo M w/ closed, left thumb UCL avulsion fracture and nondisplaced left radial styloid fracture after colliding with player during baseball game.   Plan: OR today for ORIF left thumb UCL fracture.  Likely non operative management of radial styloid fracture. We again reviewed the risks of surgery which include bleeding, infection, damage to neurovascular structures, persistent symptoms, nonunion, malunion, persistent instability, thumb stiffness, delayed wound healing, need for additional surgery.  Informed consent was signed.  All questions were answered.   Marlyne Beards, M.D. [facility] 6:56 AM

## 2022-12-24 NOTE — Progress Notes (Signed)
Assisted Dr. Brock with left, axillary, ultrasound guided block. Side rails up, monitors on throughout procedure. See vital signs in flow sheet. Tolerated Procedure well. 

## 2022-12-24 NOTE — Interval H&P Note (Signed)
History and Physical Interval Note:  12/24/2022 7:28 AM  Adrian Henry  has presented today for surgery, with the diagnosis of Left radial styloid fracture, left thumb ulnar collateral ligament  avulsion fracture.  The various methods of treatment have been discussed with the patient and family. After consideration of risks, benefits and other options for treatment, the patient has consented to  Procedure(s) with comments: OPEN REDUCTION INTERNAL FIXATION (ORIF) radial styloid fracture, OPEN REDUCTION INTERNAL FIXATION THUMB ULNAR COLLATERAL LIGAMENT ALVUSION FRACTURE (Left) - regional block  140 as a surgical intervention.  The patient's history has been reviewed, patient examined, no change in status, stable for surgery.  I have reviewed the patient's chart and labs.  Questions were answered to the patient's satisfaction.     Nate Perri Camilia [place]

## 2022-12-24 NOTE — Discharge Instructions (Addendum)
Adrian Henry, M.D. Hand Surgery  POST-OPERATIVE DISCHARGE INSTRUCTIONS   PRESCRIPTIONS: You may have been given a prescription to be taken as directed for post-operative pain control.  You may also take over the counter ibuprofen/aleve and tylenol for pain. Take this as directed on the packaging. Do not exceed 3000 mg tylenol/acetaminophen in 24 hours.  Ibuprofen 600-800 mg (3-4) tablets by mouth every 6 hours as needed for pain.  OR Aleve 2 tablets by mouth every 12 hours (twice daily) as needed for pain.  AND/OR Tylenol 1000 mg (2 tablets) every 8 hours as needed for pain.  Please use your pain medication carefully, as refills are limited and you may not be provided with one.  As stated above, please use over the counter pain medicine - it will also be helpful with decreasing your swelling.    ANESTHESIA: After your surgery, post-surgical discomfort or pain is likely. This discomfort can last several days to a few weeks. At certain times of the day your discomfort may be more intense.   Did you receive a nerve block?  A nerve block can provide pain relief for one hour to two days after your surgery. As long as the nerve block is working, you will experience little or no sensation in the area the surgeon operated on.  As the nerve block wears off, you will begin to experience pain or discomfort. It is very important that you begin taking your prescribed pain medication before the nerve block fully wears off. Treating your pain at the first sign of the block wearing off will ensure your pain is better controlled and more tolerable when full-sensation returns. Do not wait until the pain is intolerable, as the medicine will be less effective. It is better to treat pain in advance than to try and catch up.   General Anesthesia:  If you did not receive a nerve block during your surgery, you will need to start taking your pain medication shortly after your surgery and should continue  to do so as prescribed by your surgeon.     ICE AND ELEVATION: You may use ice for the first 48-72 hours, but it is not critical.   Motion of your fingers is very important to decrease the swelling.  Elevation, as much as possible for the next 48 hours, is critical for decreasing swelling as well as for pain relief. Elevation means when you are seated or lying down, you hand should be at or above your heart. When walking, the hand needs to be at or above the level of your elbow.  If the bandage gets too tight, it may need to be loosened. Please contact our office and we will instruct you in how to do this.    SURGICAL BANDAGES:  Keep your dressing and/or splint clean and dry at all times.  Do not remove until you are seen again in the office.  If careful, you may place a plastic bag over your bandage and tape the end to shower, but be careful, do not get your bandages wet.     HAND THERAPY:  Therapy will start in 4-6 weeks postop.    ACTIVITY AND WORK: You are encouraged to move any fingers which are not in the bandage.  Do not attempt to move your wrist or thumb.  Light use of the fingers is allowed to assist the other hand with daily hygiene and eating, but strong gripping or lifting is often uncomfortable and should be avoided.  You might miss a variable period of time from work and hopefully this issue has been discussed prior to surgery. You may not do any heavy work with your affected hand for about 2 weeks.    [facility] Second Floor, [address] [place], [place] [address] [phone]    Post Anesthesia Home Care Instructions  Activity: Get plenty of rest for the remainder of the day. A responsible individual must stay with you for 24 hours following the procedure.  For the next 24 hours, DO NOT: -Drive a car -Advertising copywriter -Drink alcoholic beverages -Take any medication unless instructed by your physician -Make any legal decisions or sign important  papers.  Meals: Start with liquid foods such as gelatin or soup. Progress to regular foods as tolerated. Avoid greasy, spicy, heavy foods. If nausea and/or vomiting occur, drink only clear liquids until the nausea and/or vomiting subsides. Call your physician if vomiting continues.  Special Instructions/Symptoms: Your throat may feel dry or sore from the anesthesia or the breathing tube placed in your throat during surgery. If this causes discomfort, gargle with warm salt water. The discomfort should disappear within 24 hours.  If you had a scopolamine patch placed behind your ear for the management of post- operative nausea and/or vomiting:  1. The medication in the patch is effective for 72 hours, after which it should be removed.  Wrap patch in a tissue and discard in the trash. Wash hands thoroughly with soap and water. 2. You may remove the patch earlier than 72 hours if you experience unpleasant side effects which may include dry mouth, dizziness or visual disturbances. 3. Avoid touching the patch. Wash your hands with soap and water after contact with the patch.     Regional Anesthesia Blocks  1. Numbness or the inability to move the "blocked" extremity may last from 3-48 hours after placement. The length of time depends on the medication injected and your individual response to the medication. If the numbness is not going away after 48 hours, call your surgeon.  2. The extremity that is blocked will need to be protected until the numbness is gone and the  Strength has returned. Because you cannot feel it, you will need to take extra care to avoid injury. Because it may be weak, you may have difficulty moving it or using it. You may not know what position it is in without looking at it while the block is in effect.  3. For blocks in the legs and feet, returning to weight bearing and walking needs to be done carefully. You will need to wait until the numbness is entirely gone and the  strength has returned. You should be able to move your leg and foot normally before you try and bear weight or walk. You will need someone to be with you when you first try to ensure you do not fall and possibly risk injury.  4. Bruising and tenderness at the needle site are common side effects and will resolve in a few days.  5. Persistent numbness or new problems with movement should be communicated to the surgeon or the [facility] [phone] [facility] [phone]).  *May have Tylenol today at 12:30pm

## 2022-12-24 NOTE — Transfer of Care (Signed)
Immediate Anesthesia Transfer of Care Note  Patient: Adrian Henry  Procedure(s) Performed: OPEN REDUCTION INTERNAL FIXATION THUMB ULNAR COLLATERAL LIGAMENT ALVUSION FRACTURE (Left: Wrist)  Patient Location: PACU  Anesthesia Type:MAC combined with regional for post-op pain  Level of Consciousness: drowsy  Airway & Oxygen Therapy: Patient Spontanous Breathing and Patient connected to face mask oxygen  Post-op Assessment: Report given to RN and Post -op Vital signs reviewed and stable  Post vital signs: Reviewed and stable  Last Vitals:  Vitals Value Taken Time  BP 111/55 12/24/22 0912  Temp    Pulse 53 12/24/22 0914  Resp 15 12/24/22 0914  SpO2 99 % 12/24/22 0914  Vitals shown include unvalidated device data.  Last Pain:  Vitals:   12/24/22 0623  TempSrc: Oral  PainSc: 0-No pain      Patients Stated Pain Goal: 3 (12/24/22 0454)  Complications: No notable events documented.

## 2022-12-24 NOTE — Anesthesia Procedure Notes (Signed)
Anesthesia Regional Block: Axillary brachial plexus block   Pre-Anesthetic Checklist: , timeout performed,  Correct Patient, Correct Site, Correct Laterality,  Correct Procedure, Correct Position, site marked,  Risks and benefits discussed,  Surgical consent,  Pre-op evaluation,  At surgeon's request and post-op pain management  Laterality: Left  Prep: chloraprep       Needles:  Injection technique: Single-shot  Needle Type: Echogenic Needle     Needle Length: 9cm  Needle Gauge: 21     Additional Needles:   Procedures:,,,, ultrasound used (permanent image in chart),,    Narrative:  Start time: 12/24/2022 6:58 AM End time: 12/24/2022 7:08 AM Injection made incrementally with aspirations every 5 mL.  Performed by: Personally  Anesthesiologist: Collene Schlichter, MD  Additional Notes: No pain on injection. No increased resistance to injection. Injection made in 5cc increments.  Good needle visualization.  Patient tolerated procedure well.

## 2022-12-25 ENCOUNTER — Encounter (HOSPITAL_BASED_OUTPATIENT_CLINIC_OR_DEPARTMENT_OTHER): Payer: Self-pay | Admitting: Orthopedic Surgery

## 2022-12-25 NOTE — Anesthesia Postprocedure Evaluation (Signed)
Anesthesia Post Note  Patient: Adrian Henry  Procedure(s) Performed: OPEN REDUCTION INTERNAL FIXATION THUMB ULNAR COLLATERAL LIGAMENT ALVUSION FRACTURE (Left: Wrist)     Patient location during evaluation: PACU Anesthesia Type: Regional Level of consciousness: awake and alert Pain management: pain level controlled Vital Signs Assessment: post-procedure vital signs reviewed and stable Respiratory status: spontaneous breathing, nonlabored ventilation, respiratory function stable and patient connected to nasal cannula oxygen Cardiovascular status: stable and blood pressure returned to baseline Postop Assessment: no apparent nausea or vomiting Anesthetic complications: no   No notable events documented.  Last Vitals:  Vitals:   12/24/22 0945 12/24/22 1005  BP: (!) 145/92 113/87  Pulse: (!) 55 (!) 59  Resp: 16 18  Temp:  36.4 C  SpO2: 97% 97%    Last Pain:  Vitals:   12/25/22 1029  TempSrc:   PainSc: 0-No pain   Pain Goal: Patients Stated Pain Goal: 2 (12/24/22 1019)                 Collene Schlichter
